# Patient Record
Sex: Female | Born: 1980 | Race: Black or African American | Hispanic: No | Marital: Single | Smoking: Current every day smoker
Health system: Southern US, Community
[De-identification: ages and names within clinical notes are randomized; demographics above are authoritative.]

## PROBLEM LIST (undated history)

## (undated) ENCOUNTER — Inpatient Hospital Stay (HOSPITAL_COMMUNITY): Payer: Self-pay

## (undated) DIAGNOSIS — F32A Depression, unspecified: Secondary | ICD-10-CM

## (undated) DIAGNOSIS — G894 Chronic pain syndrome: Secondary | ICD-10-CM

## (undated) DIAGNOSIS — A599 Trichomoniasis, unspecified: Secondary | ICD-10-CM

## (undated) DIAGNOSIS — N83209 Unspecified ovarian cyst, unspecified side: Secondary | ICD-10-CM

## (undated) DIAGNOSIS — D649 Anemia, unspecified: Secondary | ICD-10-CM

## (undated) DIAGNOSIS — J309 Allergic rhinitis, unspecified: Secondary | ICD-10-CM

## (undated) DIAGNOSIS — Z87820 Personal history of traumatic brain injury: Secondary | ICD-10-CM

## (undated) DIAGNOSIS — B999 Unspecified infectious disease: Secondary | ICD-10-CM

## (undated) DIAGNOSIS — F319 Bipolar disorder, unspecified: Secondary | ICD-10-CM

## (undated) DIAGNOSIS — R519 Headache, unspecified: Secondary | ICD-10-CM

## (undated) DIAGNOSIS — R51 Headache: Secondary | ICD-10-CM

## (undated) DIAGNOSIS — Z5189 Encounter for other specified aftercare: Secondary | ICD-10-CM

## (undated) DIAGNOSIS — N739 Female pelvic inflammatory disease, unspecified: Secondary | ICD-10-CM

## (undated) DIAGNOSIS — F419 Anxiety disorder, unspecified: Secondary | ICD-10-CM

## (undated) DIAGNOSIS — D563 Thalassemia minor: Secondary | ICD-10-CM

## (undated) DIAGNOSIS — M1990 Unspecified osteoarthritis, unspecified site: Secondary | ICD-10-CM

## (undated) DIAGNOSIS — F329 Major depressive disorder, single episode, unspecified: Secondary | ICD-10-CM

## (undated) HISTORY — DX: Trichomoniasis, unspecified: A59.9

## (undated) HISTORY — PX: PELVIC FRACTURE SURGERY: SHX119

## (undated) HISTORY — DX: Allergic rhinitis, unspecified: J30.9

## (undated) HISTORY — PX: OTHER SURGICAL HISTORY: SHX169

## (undated) HISTORY — DX: Chronic pain syndrome: G89.4

## (undated) HISTORY — DX: Personal history of traumatic brain injury: Z87.820

## (undated) HISTORY — DX: Bipolar disorder, unspecified: F31.9

## (undated) HISTORY — PX: HIP FRACTURE SURGERY: SHX118

---
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
## 2002-11-11 ENCOUNTER — Emergency Department (HOSPITAL_COMMUNITY): Admission: EM | Admit: 2002-11-11 | Discharge: 2002-11-11 | Payer: Self-pay | Admitting: Emergency Medicine

## 2002-11-13 ENCOUNTER — Emergency Department (HOSPITAL_COMMUNITY): Admission: EM | Admit: 2002-11-13 | Discharge: 2002-11-13 | Payer: Self-pay | Admitting: Emergency Medicine

## 2003-05-14 ENCOUNTER — Emergency Department (HOSPITAL_COMMUNITY): Admission: EM | Admit: 2003-05-14 | Discharge: 2003-05-14 | Payer: Self-pay | Admitting: Emergency Medicine

## 2003-08-30 ENCOUNTER — Emergency Department (HOSPITAL_COMMUNITY): Admission: EM | Admit: 2003-08-30 | Discharge: 2003-08-30 | Payer: Self-pay | Admitting: Emergency Medicine

## 2003-09-01 ENCOUNTER — Emergency Department (HOSPITAL_COMMUNITY): Admission: EM | Admit: 2003-09-01 | Discharge: 2003-09-01 | Payer: Self-pay | Admitting: *Deleted

## 2003-12-29 ENCOUNTER — Emergency Department (HOSPITAL_COMMUNITY): Admission: EM | Admit: 2003-12-29 | Discharge: 2003-12-29 | Payer: Self-pay | Admitting: Emergency Medicine

## 2004-02-02 ENCOUNTER — Inpatient Hospital Stay (HOSPITAL_COMMUNITY): Admission: AC | Admit: 2004-02-02 | Discharge: 2004-02-15 | Payer: Self-pay

## 2004-02-12 ENCOUNTER — Ambulatory Visit: Payer: Self-pay

## 2004-02-15 ENCOUNTER — Inpatient Hospital Stay (HOSPITAL_COMMUNITY)
Admission: RE | Admit: 2004-02-15 | Discharge: 2004-02-18 | Payer: Self-pay | Admitting: Physical Medicine & Rehabilitation

## 2004-03-21 ENCOUNTER — Encounter
Admission: RE | Admit: 2004-03-21 | Discharge: 2004-06-19 | Payer: Self-pay | Admitting: Physical Medicine & Rehabilitation

## 2004-03-22 ENCOUNTER — Ambulatory Visit: Payer: Self-pay | Admitting: Physical Medicine & Rehabilitation

## 2004-08-22 ENCOUNTER — Emergency Department (HOSPITAL_COMMUNITY): Admission: EM | Admit: 2004-08-22 | Discharge: 2004-08-22 | Payer: Self-pay | Admitting: Emergency Medicine

## 2004-10-27 ENCOUNTER — Emergency Department (HOSPITAL_COMMUNITY): Admission: EM | Admit: 2004-10-27 | Discharge: 2004-10-27 | Payer: Self-pay | Admitting: Emergency Medicine

## 2006-06-05 ENCOUNTER — Emergency Department (HOSPITAL_COMMUNITY): Admission: EM | Admit: 2006-06-05 | Discharge: 2006-06-05 | Payer: Self-pay | Admitting: Emergency Medicine

## 2007-01-19 ENCOUNTER — Emergency Department (HOSPITAL_COMMUNITY): Admission: EM | Admit: 2007-01-19 | Discharge: 2007-01-20 | Payer: Self-pay | Admitting: Emergency Medicine

## 2007-11-18 ENCOUNTER — Encounter: Admission: RE | Admit: 2007-11-18 | Discharge: 2007-11-18 | Payer: Self-pay | Admitting: Family Medicine

## 2008-08-10 ENCOUNTER — Emergency Department (HOSPITAL_COMMUNITY): Admission: EM | Admit: 2008-08-10 | Discharge: 2008-08-10 | Payer: Self-pay | Admitting: Emergency Medicine

## 2008-08-27 ENCOUNTER — Ambulatory Visit (HOSPITAL_COMMUNITY): Admission: RE | Admit: 2008-08-27 | Discharge: 2008-08-27 | Payer: Self-pay | Admitting: Obstetrics

## 2008-09-14 ENCOUNTER — Emergency Department (HOSPITAL_COMMUNITY): Admission: EM | Admit: 2008-09-14 | Discharge: 2008-09-14 | Payer: Self-pay | Admitting: Emergency Medicine

## 2008-09-16 ENCOUNTER — Inpatient Hospital Stay (HOSPITAL_COMMUNITY): Admission: AD | Admit: 2008-09-16 | Discharge: 2008-09-16 | Payer: Self-pay | Admitting: Obstetrics & Gynecology

## 2008-11-17 ENCOUNTER — Ambulatory Visit (HOSPITAL_COMMUNITY): Admission: RE | Admit: 2008-11-17 | Discharge: 2008-11-17 | Payer: Self-pay | Admitting: Obstetrics

## 2009-01-28 ENCOUNTER — Ambulatory Visit (HOSPITAL_COMMUNITY): Admission: RE | Admit: 2009-01-28 | Discharge: 2009-01-28 | Payer: Self-pay | Admitting: Obstetrics

## 2010-05-22 ENCOUNTER — Encounter: Payer: Self-pay | Admitting: Obstetrics

## 2010-08-05 LAB — RH IMMUNE GLOBULIN WORKUP (NOT WOMEN'S HOSP)
ABO/RH(D): A NEG
Antibody Screen: NEGATIVE

## 2010-08-09 LAB — URINALYSIS, ROUTINE W REFLEX MICROSCOPIC
Glucose, UA: NEGATIVE mg/dL
Leukocytes, UA: NEGATIVE
Nitrite: NEGATIVE
pH: 6 (ref 5.0–8.0)

## 2010-08-09 LAB — RHOGAM INJECTION

## 2010-08-09 LAB — URINE MICROSCOPIC-ADD ON

## 2010-08-09 LAB — WET PREP, GENITAL: WBC, Wet Prep HPF POC: NONE SEEN

## 2010-08-09 LAB — PREGNANCY, URINE: Preg Test, Ur: POSITIVE

## 2010-08-10 LAB — POCT I-STAT, CHEM 8
Calcium, Ion: 1.16 mmol/L (ref 1.12–1.32)
Chloride: 106 mEq/L (ref 96–112)
Glucose, Bld: 91 mg/dL (ref 70–99)
HCT: 44 % (ref 36.0–46.0)
Hemoglobin: 15 g/dL (ref 12.0–15.0)

## 2010-08-10 LAB — URINALYSIS, ROUTINE W REFLEX MICROSCOPIC
Bilirubin Urine: NEGATIVE
Glucose, UA: NEGATIVE mg/dL
Protein, ur: NEGATIVE mg/dL
Specific Gravity, Urine: 1.02 (ref 1.005–1.030)
Urobilinogen, UA: 1 mg/dL (ref 0.0–1.0)

## 2010-08-10 LAB — URINE MICROSCOPIC-ADD ON

## 2010-08-10 LAB — HCG, QUANTITATIVE, PREGNANCY: hCG, Beta Chain, Quant, S: 720 m[IU]/mL — ABNORMAL HIGH (ref <5)

## 2010-08-10 LAB — PREGNANCY, URINE: Preg Test, Ur: POSITIVE

## 2011-02-09 LAB — CBC
Hemoglobin: 11.6 — ABNORMAL LOW
MCHC: 32.1
Platelets: 265
RDW: 14.4 — ABNORMAL HIGH

## 2011-02-09 LAB — URINE MICROSCOPIC-ADD ON

## 2011-02-09 LAB — WET PREP, GENITAL
Clue Cells Wet Prep HPF POC: NONE SEEN
Yeast Wet Prep HPF POC: NONE SEEN

## 2011-02-09 LAB — DIFFERENTIAL
Basophils Absolute: 0.1
Basophils Relative: 1
Lymphocytes Relative: 22
Neutro Abs: 6.9
Neutrophils Relative %: 68

## 2011-02-09 LAB — URINALYSIS, ROUTINE W REFLEX MICROSCOPIC
Bilirubin Urine: NEGATIVE
Glucose, UA: NEGATIVE
Specific Gravity, Urine: 1.027
Urobilinogen, UA: 1

## 2011-05-02 NOTE — L&D Delivery Note (Signed)
Delivery Note At 10:47 PM a viable female was delivered via Vaginal, Spontaneous Delivery (Presentation: Left Occiput Anterior).  APGAR: 9, 9; weight .   Placenta status: Intact, Spontaneous.  Cord: 3 vessels with the following complications: None.  Cord pH: none  Anesthesia: None  Episiotomy: None Lacerations: None Suture Repair: none Est. Blood Loss (mL): 200  Mom to postpartum.  Baby to nursery-stable.  HARPER,CHARLES A 03/21/2012, 11:25 PM

## 2011-08-02 ENCOUNTER — Encounter (HOSPITAL_COMMUNITY): Payer: Self-pay | Admitting: *Deleted

## 2011-08-02 ENCOUNTER — Inpatient Hospital Stay (HOSPITAL_COMMUNITY)
Admission: AD | Admit: 2011-08-02 | Discharge: 2011-08-02 | Disposition: A | Discharge status: Home / Self Care | Payer: Self-pay | Source: Ambulatory Visit | Attending: Obstetrics & Gynecology | Admitting: Obstetrics & Gynecology

## 2011-08-02 DIAGNOSIS — O219 Vomiting of pregnancy, unspecified: Secondary | ICD-10-CM

## 2011-08-02 DIAGNOSIS — O210 Mild hyperemesis gravidarum: Secondary | ICD-10-CM | POA: Insufficient documentation

## 2011-08-02 HISTORY — DX: Depression, unspecified: F32.A

## 2011-08-02 HISTORY — DX: Major depressive disorder, single episode, unspecified: F32.9

## 2011-08-02 HISTORY — DX: Anemia, unspecified: D64.9

## 2011-08-02 HISTORY — DX: Anxiety disorder, unspecified: F41.9

## 2011-08-02 LAB — URINALYSIS, ROUTINE W REFLEX MICROSCOPIC
Glucose, UA: NEGATIVE mg/dL
Ketones, ur: 15 mg/dL — AB
Leukocytes, UA: NEGATIVE
Protein, ur: NEGATIVE mg/dL
pH: 6 (ref 5.0–8.0)

## 2011-08-02 LAB — URINE MICROSCOPIC-ADD ON

## 2011-08-02 LAB — POCT PREGNANCY, URINE: Preg Test, Ur: POSITIVE — AB

## 2011-08-02 MED ORDER — ONDANSETRON 8 MG PO TBDP
8.0000 mg | ORAL_TABLET | Freq: Once | ORAL | Status: AC
  Administered 2011-08-02: 8 mg via ORAL
  Filled 2011-08-02: qty 1

## 2011-08-02 MED ORDER — PROMETHAZINE HCL 25 MG PO TABS: 25.0000 mg | ORAL_TABLET | Freq: Four times a day (QID) | ORAL | Status: DC | PRN

## 2011-08-02 NOTE — MAU Provider Note (Signed)
History     CSN: [id number]  Arrival date and time: 08/02/11 2127   None     Chief Complaint  Patient presents with  . Possible Pregnancy   HPI 31 y.o. G3P2002 at 6.0 weeks. + nausea and vomiting x 1 week. Some pelvic "pressure", no pain or bleeding.    Past Medical History  Diagnosis Date  . Anemia   . Anxiety   . Depression     Past Surgical History  Procedure Date  . Pelvic fracture surgery   . Hip fracture surgery     History reviewed. No pertinent family history.  History  Substance Use Topics  . Smoking status: Current Everyday Smoker  . Smokeless tobacco: Not on file  . Alcohol Use: No    Allergies: No Known Allergies  No prescriptions prior to admission    Review of Systems  Constitutional: Negative.   Respiratory: Negative.   Cardiovascular: Negative.   Gastrointestinal: Positive for nausea and vomiting. Negative for abdominal pain, diarrhea and constipation.  Genitourinary: Negative for dysuria, urgency, frequency, hematuria and flank pain.       Negative for vaginal bleeding, vaginal discharge, dyspareunia  Musculoskeletal: Negative.   Neurological: Negative.   Psychiatric/Behavioral: Negative.    Physical Exam   Blood pressure 125/68, pulse 74, temperature 99 F (37.2 C), temperature source Oral, resp. rate 18, height 5\' 4"  (1.626 m), weight 113 lb (51.256 kg), last menstrual period 06/21/2011, SpO2 99.00%.  Physical Exam  Nursing note and vitals reviewed. Constitutional: She is oriented to person, place, and time. She appears well-developed and well-nourished. No distress.  Cardiovascular: Normal rate.   Respiratory: Effort normal.  Musculoskeletal: Normal range of motion.  Neurological: She is alert and oriented to person, place, and time.  Skin: Skin is warm and dry.  Psychiatric: She has a normal mood and affect.    MAU Course  Procedures  Results for orders placed during the hospital encounter of 08/02/11 (from the past 24  hour(s))  URINALYSIS, ROUTINE W REFLEX MICROSCOPIC     Status: Abnormal   Collection Time   08/02/11  9:50 PM      Component Value Range   Color, Urine YELLOW  YELLOW    APPearance CLEAR  CLEAR    Specific Gravity, Urine >1.030 (*) 1.005 - 1.030    pH 6.0  5.0 - 8.0    Glucose, UA NEGATIVE  NEGATIVE (mg/dL)   Hgb urine dipstick LARGE (*) NEGATIVE    Bilirubin Urine NEGATIVE  NEGATIVE    Ketones, ur 15 (*) NEGATIVE (mg/dL)   Protein, ur NEGATIVE  NEGATIVE (mg/dL)   Urobilinogen, UA 0.2  0.0 - 1.0 (mg/dL)   Nitrite NEGATIVE  NEGATIVE    Leukocytes, UA NEGATIVE  NEGATIVE   URINE MICROSCOPIC-ADD ON     Status: Abnormal   Collection Time   08/02/11  9:50 PM      Component Value Range   Squamous Epithelial / LPF FEW (*) RARE    WBC, UA 3-6  <3 (WBC/hpf)   RBC / HPF 11-20  <3 (RBC/hpf)   Bacteria, UA FEW (*) RARE    Urine-Other MUCOUS PRESENT    POCT PREGNANCY, URINE     Status: Abnormal   Collection Time   08/02/11  9:54 PM      Component Value Range   Preg Test, Ur POSITIVE (*) NEGATIVE    Zofran ODT for n/v in MAU  Assessment and Plan  31 y.o. G3P2002 at 6.[redacted] weeks EGA N/V  in pregnancy - rx phenergan Precautions rev'd, f/u for prenatal care ASAP  FRAZIER,NATALIE 08/02/2011, 10:47 PM

## 2011-08-02 NOTE — MAU Note (Signed)
Pt reports naseated  X 1 week, LMP 06/21/2011, positive home preg test.

## 2011-08-06 ENCOUNTER — Encounter (HOSPITAL_COMMUNITY): Payer: Self-pay | Admitting: *Deleted

## 2011-08-06 ENCOUNTER — Inpatient Hospital Stay (HOSPITAL_COMMUNITY)
Admission: AD | Admit: 2011-08-06 | Discharge: 2011-08-06 | Disposition: A | Discharge status: Hospice | Payer: Self-pay | Source: Ambulatory Visit | Attending: Obstetrics and Gynecology | Admitting: Obstetrics and Gynecology

## 2011-08-06 DIAGNOSIS — O210 Mild hyperemesis gravidarum: Secondary | ICD-10-CM | POA: Insufficient documentation

## 2011-08-06 DIAGNOSIS — O219 Vomiting of pregnancy, unspecified: Secondary | ICD-10-CM

## 2011-08-06 HISTORY — DX: Unspecified osteoarthritis, unspecified site: M19.90

## 2011-08-06 LAB — URINE MICROSCOPIC-ADD ON

## 2011-08-06 LAB — URINALYSIS, ROUTINE W REFLEX MICROSCOPIC
Glucose, UA: NEGATIVE mg/dL
Ketones, ur: 15 mg/dL — AB
Protein, ur: NEGATIVE mg/dL
Urobilinogen, UA: 1 mg/dL (ref 0.0–1.0)

## 2011-08-06 MED ORDER — PROMETHAZINE HCL 25 MG/ML IJ SOLN
25.0000 mg | Freq: Once | INTRAVENOUS | Status: AC
  Administered 2011-08-06: 25 mg via INTRAVENOUS
  Filled 2011-08-06: qty 1

## 2011-08-06 MED ORDER — PROMETHAZINE HCL 25 MG RE SUPP: 25.0000 mg | Freq: Four times a day (QID) | RECTAL | Status: DC | PRN

## 2011-08-06 NOTE — MAU Provider Note (Signed)
Attestation of Attending Supervision of Advanced Practitioner: Evaluation and management procedures were performed by the PA/NP/CNM/OB Fellow under my supervision/collaboration. Chart reviewed and agree with management and plan.  Kimsey Demaree V 08/06/2011 9:27 PM

## 2011-08-06 NOTE — Discharge Instructions (Signed)
Morning Sickness Morning sickness is when you feel sick to your stomach (nauseous) during pregnancy. You may feel sick to your stomach and throw up (vomit). You may feel sick in the morning, but you can feel this way any time of day. Some women feel very sick to their stomach and cannot stop throwing up (hyperemesis gravidarum). HOME CARE  Take multivitamins as told by your doctor. Taking multivitamins before getting pregnant can stop or lessen the harshness of morning sickness.   Eat dry toast or unsalted crackers before getting out of bed.   Eat 5 to 6 small meals a day.   Eat dry and bland foods like rice and baked potatoes.   Do not drink liquids with meals. Drink between meals.   Do not eat greasy, fatty, or spicy foods.   Have someone cook for you if the smell of food causes you to feel sick or throw up.   Do not take vitamins with iron, or as told by your doctor.   Eat protein when you need a snack (nuts, yogurt, cheese).   Eat unsweetened gelatins for dessert.   Wear a bracelet used for sea sickness (acupressure wristband).   Go to a doctor that puts thin needles into certain body points (acupuncture) to improve how you feel.   Do not smoke.   Use a humidifier to keep the air in your house free of odors.  GET HELP RIGHT AWAY IF:   You feel very sick to your stomach and cannot stop throwing up.   You pass out (faint).   You have a fever.   You need medicine to feel better.   You feel dizzy or lightheaded.   You are losing weight.   You need help knowing what to eat and what not to eat.  MAKE SURE YOU:   Understand these instructions.   Will watch your condition.   Will get help right away if you are not doing well or get worse.  Document Released: 05/25/2004 Document Revised: 04/06/2011 Document Reviewed: 07/15/2009 [employer] Patient Information 2012 [employer]. 

## 2011-08-06 NOTE — MAU Provider Note (Signed)
History     CSN: [id number]  Arrival date and time: 08/06/11 1707   First Provider Initiated Contact with Patient 08/06/11 1738      No chief complaint on file.  HPI  Angela Andrade is 31 y.o. G3P2002 [redacted]w[redacted]d gestation by LMP 2/20 returns to MUA with complaints of nausea, vomiting and weakness.   Was seen in MAU on 4/3 with same complaint given Rx for Phenergan, that has not worked.  Did not like Zofran given in MAU because it didn't help.  Patient reports being hungry.  Tried to eat a baked potato vomited 30 minutes later.  Fluids are more difficult to keep down.  She denies vaginal bleeding and abdominal/pelvic pain.   Past Medical History  Diagnosis Date  . Anemia   . Anxiety   . Depression   . Arthritis     Past Surgical History  Procedure Date  . Pelvic fracture surgery   . Hip fracture surgery   . Coma   . Spleen repair   . Brain injury     Family History  Problem Relation Age of Onset  . Cancer Mother   . Hypertension Mother   . Diabetes Maternal Aunt   . Hypertension Maternal Aunt   . Diabetes Maternal Uncle   . Hypertension Maternal Uncle   . Cancer Maternal Grandmother   . Diabetes Maternal Grandmother   . Hypertension Maternal Grandmother     History  Substance Use Topics  . Smoking status: Former Smoker -- 0.2 packs/day    Types: Cigarettes    Quit date: 07/06/2011  . Smokeless tobacco: Not on file  . Alcohol Use: No    Allergies: No Known Allergies  Prescriptions prior to admission  Medication Sig Dispense Refill  . promethazine (PHENERGAN) 25 MG tablet Take 1 tablet (25 mg total) by mouth every 6 (six) hours as needed for nausea.  60 tablet  0    Review of Systems  Constitutional: Negative.   Gastrointestinal: Positive for nausea and vomiting. Negative for abdominal pain.  Genitourinary: Negative.    Physical Exam   Last menstrual period 06/21/2011.  Physical Exam  Constitutional: She is oriented to person, place, and time. She appears  well-developed and well-nourished.  HENT:  Head: Normocephalic.  Neck: Normal range of motion.  Neurological: She is alert and oriented to person, place, and time.  Skin: Skin is warm and dry.  Psychiatric: She has a normal mood and affect. Her behavior is normal.     Results for orders placed during the hospital encounter of 08/06/11 (from the past 24 hour(s))  URINALYSIS, ROUTINE W REFLEX MICROSCOPIC     Status: Abnormal   Collection Time   08/06/11  5:20 PM      Component Value Range   Color, Urine YELLOW  YELLOW    APPearance HAZY (*) CLEAR    Specific Gravity, Urine 1.015  1.005 - 1.030    pH 8.0  5.0 - 8.0    Glucose, UA NEGATIVE  NEGATIVE (mg/dL)   Hgb urine dipstick MODERATE (*) NEGATIVE    Bilirubin Urine NEGATIVE  NEGATIVE    Ketones, ur 15 (*) NEGATIVE (mg/dL)   Protein, ur NEGATIVE  NEGATIVE (mg/dL)   Urobilinogen, UA 1.0  0.0 - 1.0 (mg/dL)   Nitrite NEGATIVE  NEGATIVE    Leukocytes, UA TRACE (*) NEGATIVE   URINE MICROSCOPIC-ADD ON     Status: Abnormal   Collection Time   08/06/11  5:20 PM  Component Value Range   Squamous Epithelial / LPF MANY (*) RARE    WBC, UA 3-6  <3 (WBC/hpf)   RBC / HPF 3-6  <3 (RBC/hpf)   Bacteria, UA MANY (*) RARE    MAU Course  Procedures   Urine culture to lab  MDM 1 liter of LR with Phenergan 25mg  bolus.  Patient has a ride home.   Urine culture pending.  Patient is not symptomatic-will wait for culture to treat.   18:45  Went in to check on patient.  She is feeling better and asking for crackers and sprite.  19:15  Patient was able to keep 4 graham crackers and a small sprite down.  Slightly nauseated but better with hydration and phenergan.  She is ready to go home.  Discussed how to use Phenergan suppositories.  Assessment and Plan  A;  Nausea and Vomiting in first trimester pregancy  P:  Rx for Phenergan suppositories      Encouraged her to begin prenatal care.  Plans care with [facility]     Encouraged to eat 5-6 small meals  a day  Calvyn Kurtzman,EVE M 08/06/2011, 5:40 PM

## 2011-08-06 NOTE — MAU Note (Signed)
Pt states she cannot keep anything down for the past week.  Was prescribed phenergan po and unable to keep them down.

## 2011-08-08 LAB — URINE CULTURE

## 2011-10-13 ENCOUNTER — Other Ambulatory Visit: Payer: Self-pay | Admitting: Obstetrics

## 2011-10-13 DIAGNOSIS — Z3689 Encounter for other specified antenatal screening: Secondary | ICD-10-CM

## 2011-10-13 LAB — OB RESULTS CONSOLE ABO/RH: RH Type: NEGATIVE

## 2011-10-13 LAB — OB RESULTS CONSOLE GC/CHLAMYDIA
Chlamydia: NEGATIVE
Gonorrhea: NEGATIVE

## 2011-10-13 LAB — OB RESULTS CONSOLE RPR: RPR: NONREACTIVE

## 2011-10-13 LAB — OB RESULTS CONSOLE HEPATITIS B SURFACE ANTIGEN: Hepatitis B Surface Ag: NEGATIVE

## 2011-10-13 LAB — OB RESULTS CONSOLE HIV ANTIBODY (ROUTINE TESTING): HIV: NONREACTIVE

## 2011-10-13 LAB — OB RESULTS CONSOLE RUBELLA ANTIBODY, IGM: Rubella: IMMUNE

## 2011-10-16 ENCOUNTER — Ambulatory Visit (HOSPITAL_COMMUNITY)
Admission: RE | Admit: 2011-10-16 | Discharge: 2011-10-16 | Disposition: A | Discharge status: Home / Self Care | Payer: Medicaid Other | Source: Ambulatory Visit | Attending: Obstetrics | Admitting: Obstetrics

## 2011-10-16 DIAGNOSIS — O3660X0 Maternal care for excessive fetal growth, unspecified trimester, not applicable or unspecified: Secondary | ICD-10-CM | POA: Insufficient documentation

## 2011-10-16 DIAGNOSIS — Z3689 Encounter for other specified antenatal screening: Secondary | ICD-10-CM

## 2011-10-16 DIAGNOSIS — O358XX0 Maternal care for other (suspected) fetal abnormality and damage, not applicable or unspecified: Secondary | ICD-10-CM | POA: Insufficient documentation

## 2011-10-16 DIAGNOSIS — Z363 Encounter for antenatal screening for malformations: Secondary | ICD-10-CM | POA: Insufficient documentation

## 2011-10-16 DIAGNOSIS — Z1389 Encounter for screening for other disorder: Secondary | ICD-10-CM | POA: Insufficient documentation

## 2011-11-13 ENCOUNTER — Other Ambulatory Visit: Payer: Self-pay | Admitting: Obstetrics & Gynecology

## 2012-01-02 ENCOUNTER — Inpatient Hospital Stay (HOSPITAL_COMMUNITY)
Admission: AD | Admit: 2012-01-02 | Discharge: 2012-01-02 | Disposition: A | Discharge status: Home / Self Care | Payer: Medicare Other | Source: Ambulatory Visit | Attending: Obstetrics | Admitting: Obstetrics

## 2012-01-02 DIAGNOSIS — Z298 Encounter for other specified prophylactic measures: Secondary | ICD-10-CM | POA: Insufficient documentation

## 2012-01-02 DIAGNOSIS — Z2989 Encounter for other specified prophylactic measures: Secondary | ICD-10-CM | POA: Insufficient documentation

## 2012-01-02 MED ORDER — RHO D IMMUNE GLOBULIN 1500 UNIT/2ML IJ SOLN
300.0000 ug | Freq: Once | INTRAMUSCULAR | Status: AC
  Administered 2012-01-02: 300 ug via INTRAMUSCULAR
  Filled 2012-01-02: qty 2

## 2012-01-02 NOTE — MAU Note (Signed)
Rhophylac booklet given and discussed

## 2012-01-02 NOTE — MAU Note (Signed)
No adverse effect from rhophylac

## 2012-01-03 LAB — RH IG WORKUP (INCLUDES ABO/RH)
ABO/RH(D): A NEG
Antibody Screen: NEGATIVE
Fetal Screen: NEGATIVE
Gestational Age(Wks): 27.6

## 2012-03-19 ENCOUNTER — Inpatient Hospital Stay (HOSPITAL_COMMUNITY)
Admission: AD | Admit: 2012-03-19 | Discharge: 2012-03-19 | Disposition: A | Discharge status: Home / Self Care | Payer: Medicare Other | Source: Ambulatory Visit | Attending: Obstetrics | Admitting: Obstetrics

## 2012-03-19 ENCOUNTER — Encounter (HOSPITAL_COMMUNITY): Payer: Self-pay | Admitting: Obstetrics and Gynecology

## 2012-03-19 DIAGNOSIS — O479 False labor, unspecified: Secondary | ICD-10-CM | POA: Insufficient documentation

## 2012-03-19 MED ORDER — OXYCODONE-ACETAMINOPHEN 5-325 MG PO TABS
2.0000 | ORAL_TABLET | Freq: Once | ORAL | Status: AC
  Administered 2012-03-19: 2 via ORAL
  Filled 2012-03-19: qty 2

## 2012-03-19 NOTE — Progress Notes (Signed)
Dr. Clearance Coots notified of patient's cervical exam 2, 50, -3 unchanged from prior exam. Pt feels better about pain after taking the percocet. Pt has an appt tomorrow and plans to keep it in order to schedule an induction.

## 2012-03-19 NOTE — MAU Note (Signed)
Pt presents to MAU with chief complaint of labor. Pt says contractions started last night at 7:00 pm and have progressively gotten worse throughout the day. Pt is a G3P2 at [redacted]w[redacted]d.

## 2012-03-20 ENCOUNTER — Encounter (HOSPITAL_COMMUNITY): Payer: Self-pay | Admitting: *Deleted

## 2012-03-20 ENCOUNTER — Telehealth (HOSPITAL_COMMUNITY): Payer: Self-pay | Admitting: *Deleted

## 2012-03-20 NOTE — Telephone Encounter (Signed)
Preadmission screen  

## 2012-03-21 ENCOUNTER — Encounter (HOSPITAL_COMMUNITY): Payer: Self-pay | Admitting: *Deleted

## 2012-03-21 ENCOUNTER — Inpatient Hospital Stay (HOSPITAL_COMMUNITY)
Admission: AD | Admit: 2012-03-21 | Discharge: 2012-03-23 | DRG: 775 | Disposition: A | Discharge status: Home / Self Care | Payer: Medicare Other | Source: Ambulatory Visit | Attending: Obstetrics | Admitting: Obstetrics

## 2012-03-21 DIAGNOSIS — O9903 Anemia complicating the puerperium: Principal | ICD-10-CM | POA: Diagnosis not present

## 2012-03-21 DIAGNOSIS — D649 Anemia, unspecified: Secondary | ICD-10-CM | POA: Diagnosis not present

## 2012-03-21 MED ORDER — OXYTOCIN 10 UNIT/ML IJ SOLN
INTRAMUSCULAR | Status: AC
  Filled 2012-03-21: qty 1

## 2012-03-21 MED ORDER — OXYCODONE-ACETAMINOPHEN 5-325 MG PO TABS: 2.0000 | ORAL_TABLET | Freq: Once | ORAL | Status: DC

## 2012-03-21 NOTE — MAU Note (Signed)
Pt complaining of contractions that started at about 2000.

## 2012-03-21 NOTE — H&P (Signed)
Angela Andrade is a 31 y.o. female presenting for UC's. Maternal Medical History:  Reason for admission: Reason for admission: contractions.  31 yo G3 P2    EDC` 11-27- 13.  Presented with UC's.  Contractions: Onset was 6-12 hours ago.   Frequency: regular.   Perceived severity is strong.    Fetal activity: Perceived fetal activity is normal.   Last perceived fetal movement was within the past hour.    Prenatal complications: no prenatal complications Prenatal Complications - Diabetes: none.    OB History    Grav Para Term Preterm Abortions TAB SAB Ect Mult Living   3 2 2       2      Past Medical History  Diagnosis Date  . Anemia   . Anxiety   . Depression   . Arthritis    Past Surgical History  Procedure Date  . Pelvic fracture surgery   . Hip fracture surgery   . Coma   . Spleen repair   . Brain injury    Family History: family history includes Cancer in her maternal grandmother and mother; Diabetes in her maternal aunt, maternal grandmother, and maternal uncle; and Hypertension in her maternal aunt, maternal grandmother, maternal uncle, and mother. Social History:  reports that she quit smoking about 8 months ago. Her smoking use included Cigarettes. She smoked .25 packs per day. She does not have any smokeless tobacco history on file. She reports that she does not drink alcohol or use illicit drugs.   Prenatal Transfer Tool  Maternal Diabetes: No Genetic Screening: Normal Maternal Ultrasounds/Referrals: Normal Fetal Ultrasounds or other Referrals:  None Maternal Substance Abuse:  No Significant Maternal Medications:  Meds include: Other:  Significant Maternal Lab Results:  Lab values include: Group B Strep negative Other Comments:  None  Review of Systems  All other systems reviewed and are negative.    Dilation: 3 Effacement (%): 90 Station: -2 Exam by:: Weston,RN Blood pressure 139/64, pulse 82, resp. rate 22, height 5' 4.5" (1.638 m), weight 176 lb  (79.833 kg), last menstrual period 06/21/2011. Maternal Exam:  Abdomen: Patient reports no abdominal tenderness. Fetal presentation: vertex  Introitus: Normal vulva. Normal vagina.    Physical Exam  Nursing note and vitals reviewed. Constitutional: She is oriented to person, place, and time. She appears well-developed and well-nourished.  HENT:  Head: Normocephalic and atraumatic.  Eyes: Conjunctivae normal are normal. Pupils are equal, round, and reactive to light.  Neck: Normal range of motion. Neck supple.  Cardiovascular: Normal rate and regular rhythm.   Respiratory: Effort normal and breath sounds normal.  GI: Soft.  Genitourinary: Vagina normal and uterus normal.  Musculoskeletal: Normal range of motion.  Neurological: She is alert and oriented to person, place, and time.  Skin: Skin is warm and dry.  Psychiatric: She has a normal mood and affect. Her behavior is normal. Judgment and thought content normal.    Prenatal labs: ABO, Rh: --/--/A NEG (09/03 1115) Antibody: NEG (09/03 1115) Rubella: Immune (06/14 0000) RPR: Nonreactive (06/14 0000)  HBsAg: Negative (06/14 0000)  HIV: Non-reactive (06/14 0000)  GBS: Negative (10/30 0000)   Assessment/Plan: 39.1 weeks.  Active labor.  Admit.   HARPER,CHARLES A 03/21/2012, 11:18 PM

## 2012-03-21 NOTE — MAU Note (Signed)
Pt presents with contractions and some bleeding at home, SVE yesterday 2

## 2012-03-22 ENCOUNTER — Encounter (HOSPITAL_COMMUNITY): Payer: Self-pay | Admitting: *Deleted

## 2012-03-22 LAB — CBC
HCT: 26.1 % — ABNORMAL LOW (ref 36.0–46.0)
Hemoglobin: 8.3 g/dL — ABNORMAL LOW (ref 12.0–15.0)
MCHC: 31.8 g/dL (ref 30.0–36.0)
MCV: 75.2 fL — ABNORMAL LOW (ref 78.0–100.0)
RDW: 13.7 % (ref 11.5–15.5)

## 2012-03-22 MED ORDER — IBUPROFEN 600 MG PO TABS: 600.0000 mg | ORAL_TABLET | Freq: Four times a day (QID) | ORAL | Status: DC

## 2012-03-22 MED ORDER — DIPHENHYDRAMINE HCL 25 MG PO CAPS: 25.0000 mg | ORAL_CAPSULE | Freq: Four times a day (QID) | ORAL | Status: DC | PRN

## 2012-03-22 MED ORDER — ONDANSETRON HCL 4 MG/2ML IJ SOLN: 4.0000 mg | INTRAMUSCULAR | Status: DC | PRN

## 2012-03-22 MED ORDER — PRENATAL MULTIVITAMIN CH: 1.0000 | ORAL_TABLET | Freq: Every day | ORAL | Status: DC

## 2012-03-22 MED ORDER — OXYCODONE-ACETAMINOPHEN 5-325 MG PO TABS: 1.0000 | ORAL_TABLET | ORAL | Status: DC | PRN

## 2012-03-22 MED ORDER — BENZOCAINE-MENTHOL 20-0.5 % EX AERO
1.0000 "application " | INHALATION_SPRAY | CUTANEOUS | Status: DC | PRN
  Filled 2012-03-22: qty 56

## 2012-03-22 MED ORDER — MEDROXYPROGESTERONE ACETATE 150 MG/ML IM SUSP: 150.0000 mg | INTRAMUSCULAR | Status: DC | PRN

## 2012-03-22 MED ORDER — SIMETHICONE 80 MG PO CHEW: 80.0000 mg | CHEWABLE_TABLET | ORAL | Status: DC | PRN

## 2012-03-22 MED ORDER — LANOLIN HYDROUS EX OINT: TOPICAL_OINTMENT | CUTANEOUS | Status: DC | PRN

## 2012-03-22 MED ORDER — DIBUCAINE 1 % RE OINT: 1.0000 "application " | TOPICAL_OINTMENT | RECTAL | Status: DC | PRN

## 2012-03-22 MED ORDER — OXYTOCIN 40 UNITS IN LACTATED RINGERS INFUSION - SIMPLE MED: 62.5000 mL/h | INTRAVENOUS | Status: DC | PRN

## 2012-03-22 MED ORDER — PRENATAL MULTIVITAMIN CH
1.0000 | ORAL_TABLET | Freq: Every day | ORAL | Status: DC
  Administered 2012-03-22 – 2012-03-23 (×2): 1 via ORAL
  Filled 2012-03-22 (×2): qty 1

## 2012-03-22 MED ORDER — WITCH HAZEL-GLYCERIN EX PADS: 1.0000 "application " | MEDICATED_PAD | CUTANEOUS | Status: DC | PRN

## 2012-03-22 MED ORDER — OXYCODONE-ACETAMINOPHEN 5-325 MG PO TABS
1.0000 | ORAL_TABLET | ORAL | Status: DC | PRN
  Administered 2012-03-22: 2 via ORAL
  Administered 2012-03-22 (×2): 1 via ORAL
  Administered 2012-03-22: 2 via ORAL
  Administered 2012-03-23 (×2): 1 via ORAL
  Filled 2012-03-22 (×4): qty 1
  Filled 2012-03-22 (×2): qty 2

## 2012-03-22 MED ORDER — ZOLPIDEM TARTRATE 5 MG PO TABS: 5.0000 mg | ORAL_TABLET | Freq: Every evening | ORAL | Status: DC | PRN

## 2012-03-22 MED ORDER — IBUPROFEN 600 MG PO TABS
600.0000 mg | ORAL_TABLET | Freq: Four times a day (QID) | ORAL | Status: DC
  Administered 2012-03-22 – 2012-03-23 (×6): 600 mg via ORAL
  Filled 2012-03-22 (×6): qty 1

## 2012-03-22 MED ORDER — DIBUCAINE 1 % RE OINT
1.0000 | TOPICAL_OINTMENT | RECTAL | Status: DC | PRN
Start: 2012-03-22 — End: 2012-03-23

## 2012-03-22 MED ORDER — BENZOCAINE-MENTHOL 20-0.5 % EX AERO: 1.0000 "application " | INHALATION_SPRAY | CUTANEOUS | Status: DC | PRN

## 2012-03-22 MED ORDER — TETANUS-DIPHTH-ACELL PERTUSSIS 5-2.5-18.5 LF-MCG/0.5 IM SUSP: 0.5000 mL | Freq: Once | INTRAMUSCULAR | Status: DC

## 2012-03-22 MED ORDER — SENNOSIDES-DOCUSATE SODIUM 8.6-50 MG PO TABS: 2.0000 | ORAL_TABLET | Freq: Every day | ORAL | Status: DC

## 2012-03-22 MED ORDER — ONDANSETRON HCL 4 MG PO TABS: 4.0000 mg | ORAL_TABLET | ORAL | Status: DC | PRN

## 2012-03-22 MED ORDER — TETANUS-DIPHTH-ACELL PERTUSSIS 5-2.5-18.5 LF-MCG/0.5 IM SUSP
0.5000 mL | Freq: Once | INTRAMUSCULAR | Status: AC
  Administered 2012-03-23: 0.5 mL via INTRAMUSCULAR
  Filled 2012-03-22: qty 0.5

## 2012-03-22 NOTE — MAU Note (Signed)
Pt states she started feeling  Abdominal cramping after 2300

## 2012-03-22 NOTE — Progress Notes (Signed)
Patient ID: Angela Andrade, female   DOB: [birth year], 31 y.o.   MRN: [id number] Post Partum Day 1 S/P spontaneous vaginal RH status/Rubella reviewed.  Feeding: breast Subjective: No HA, SOB, CP, F/C, breast symptoms. Normal vaginal bleeding, no clots.     Objective: BP 98/61  Pulse 65  Temp 97.9 F (36.6 C) (Oral)  Resp 20  Ht 5' 4.5" (1.638 m)  Wt 79.833 kg (176 lb)  BMI 29.74 kg/m2  SpO2 98%  LMP 06/21/2011  Breastfeeding? Unknown   Physical Exam:  General: alert Lochia: appropriate Uterine Fundus: firm DVT Evaluation: No evidence of DVT seen on physical exam. Ext: No c/c/e  Basename 03/22/12 0605  HGB 8.3*  HCT 26.1*      Assessment/Plan: 31 y.o.  PPD #1 .  normal postpartum exam Anemia stable Continue current postpartum care Ambulate   LOS: 1 day   [name],[name] A 03/22/2012, 2:05 PM

## 2012-03-23 MED ORDER — RHO D IMMUNE GLOBULIN 1500 UNIT/2ML IJ SOLN
300.0000 ug | Freq: Once | INTRAMUSCULAR | Status: AC
  Administered 2012-03-23: 300 ug via INTRAMUSCULAR
  Filled 2012-03-23: qty 2
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
## 2012-03-23 NOTE — Discharge Summary (Signed)
Obstetric Discharge Summary Reason for Admission: onset of labor Prenatal Procedures: none Intrapartum Procedures: spontaneous vaginal delivery Postpartum Procedures: none Complications-Operative and Postpartum: none Hemoglobin  Date Value Range Status  03/22/2012 8.3* 12.0 - 15.0 g/dL Final     HCT  Date Value Range Status  03/22/2012 26.1* 36.0 - 46.0 % Final    Physical Exam:  General: alert Lochia: appropriate Uterine Fundus: firm Incision: healing well DVT Evaluation: No evidence of DVT seen on physical exam.  Discharge Diagnoses: Term Pregnancy-delivered  Discharge Information: Date: 03/23/2012 Activity: pelvic rest Diet: routine Medications: Percocet Condition: improved Instructions: refer to practice specific booklet Discharge to: home Follow-up Information    Call in 6 weeks to follow up.   Contact information:   c harper         Newborn Data: Live born female  Birth Weight: 7 lb 12 oz (3515 g) APGAR: 9, 9  Home with mother.  Danae Oland A 03/23/2012, 7:22 AM

## 2012-03-24 LAB — RH IG WORKUP (INCLUDES ABO/RH): ABO/RH(D): A NEG

## 2012-03-25 NOTE — Progress Notes (Signed)
Post discharge chart review completed.  

## 2012-08-02 ENCOUNTER — Other Ambulatory Visit: Payer: Self-pay | Admitting: Obstetrics

## 2013-03-16 ENCOUNTER — Encounter (HOSPITAL_COMMUNITY): Payer: Self-pay | Admitting: Emergency Medicine

## 2013-03-16 ENCOUNTER — Emergency Department (HOSPITAL_COMMUNITY)
Admission: EM | Admit: 2013-03-16 | Discharge: 2013-03-17 | Disposition: A | Discharge status: Home / Self Care | Payer: Medicare Other | Attending: Emergency Medicine | Admitting: Emergency Medicine

## 2013-03-16 DIAGNOSIS — M129 Arthropathy, unspecified: Secondary | ICD-10-CM | POA: Insufficient documentation

## 2013-03-16 DIAGNOSIS — M5416 Radiculopathy, lumbar region: Secondary | ICD-10-CM

## 2013-03-16 DIAGNOSIS — IMO0002 Reserved for concepts with insufficient information to code with codable children: Secondary | ICD-10-CM | POA: Insufficient documentation

## 2013-03-16 DIAGNOSIS — Z8659 Personal history of other mental and behavioral disorders: Secondary | ICD-10-CM | POA: Insufficient documentation

## 2013-03-16 DIAGNOSIS — Z87891 Personal history of nicotine dependence: Secondary | ICD-10-CM | POA: Insufficient documentation

## 2013-03-16 DIAGNOSIS — Z862 Personal history of diseases of the blood and blood-forming organs and certain disorders involving the immune mechanism: Secondary | ICD-10-CM | POA: Insufficient documentation

## 2013-03-16 DIAGNOSIS — G8911 Acute pain due to trauma: Secondary | ICD-10-CM | POA: Insufficient documentation

## 2013-03-16 DIAGNOSIS — G8929 Other chronic pain: Secondary | ICD-10-CM | POA: Insufficient documentation
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
## 2013-03-17 MED ORDER — DEXAMETHASONE SODIUM PHOSPHATE 10 MG/ML IJ SOLN
10.0000 mg | Freq: Once | INTRAMUSCULAR | Status: AC
  Administered 2013-03-17: 10 mg via INTRAMUSCULAR
  Filled 2013-03-17: qty 1

## 2013-03-17 MED ORDER — DIAZEPAM 5 MG PO TABS
5.0000 mg | ORAL_TABLET | Freq: Once | ORAL | Status: AC
  Administered 2013-03-17: 5 mg via ORAL
  Filled 2013-03-17: qty 1

## 2013-03-17 MED ORDER — MORPHINE SULFATE 4 MG/ML IJ SOLN
4.0000 mg | Freq: Once | INTRAMUSCULAR | Status: AC
  Administered 2013-03-17: 4 mg via INTRAMUSCULAR
  Filled 2013-03-17: qty 1

## 2013-03-17 MED ORDER — METHOCARBAMOL 500 MG PO TABS: 500.0000 mg | ORAL_TABLET | Freq: Two times a day (BID) | ORAL | Status: DC | PRN

## 2013-03-17 NOTE — ED Notes (Signed)
Discussed w/ pt d/c instructions, prescriptions and follow up care, pt verbalized understanding.  Informed pt that she would need to wait 30 minutes d/t receiving IM morphine then she could e-sign her d/c instructions. However upon entering pt's room at 0120 pt had already left w/ her friend.
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
## 2013-03-17 NOTE — ED Provider Notes (Signed)
Medical screening examination/treatment/procedure(s) were performed by non-physician practitioner and as supervising physician I was immediately available for consultation/collaboration.  EKG Interpretation   None         Hanley Seamen, MD 03/17/13 [phone]

## 2013-03-25 ENCOUNTER — Ambulatory Visit: Payer: Medicare Other | Attending: Orthopaedic Surgery | Admitting: Physical Therapy

## 2013-09-18 ENCOUNTER — Encounter (HOSPITAL_COMMUNITY): Payer: Self-pay | Admitting: Emergency Medicine

## 2013-09-18 ENCOUNTER — Emergency Department (HOSPITAL_COMMUNITY): Payer: Medicare Other

## 2013-09-18 ENCOUNTER — Emergency Department (HOSPITAL_COMMUNITY)
Admission: EM | Admit: 2013-09-18 | Discharge: 2013-09-19 | Disposition: A | Discharge status: Home / Self Care | Payer: Medicare Other | Attending: Emergency Medicine | Admitting: Emergency Medicine

## 2013-09-18 DIAGNOSIS — Z8781 Personal history of (healed) traumatic fracture: Secondary | ICD-10-CM | POA: Insufficient documentation

## 2013-09-18 DIAGNOSIS — R51 Headache: Secondary | ICD-10-CM | POA: Insufficient documentation

## 2013-09-18 DIAGNOSIS — Z79899 Other long term (current) drug therapy: Secondary | ICD-10-CM | POA: Insufficient documentation

## 2013-09-18 DIAGNOSIS — M129 Arthropathy, unspecified: Secondary | ICD-10-CM | POA: Insufficient documentation

## 2013-09-18 DIAGNOSIS — Z87891 Personal history of nicotine dependence: Secondary | ICD-10-CM | POA: Insufficient documentation

## 2013-09-18 DIAGNOSIS — Z87828 Personal history of other (healed) physical injury and trauma: Secondary | ICD-10-CM | POA: Insufficient documentation

## 2013-09-18 DIAGNOSIS — Z862 Personal history of diseases of the blood and blood-forming organs and certain disorders involving the immune mechanism: Secondary | ICD-10-CM | POA: Insufficient documentation

## 2013-09-18 DIAGNOSIS — Z8659 Personal history of other mental and behavioral disorders: Secondary | ICD-10-CM | POA: Insufficient documentation

## 2013-09-18 DIAGNOSIS — R519 Headache, unspecified: Secondary | ICD-10-CM

## 2013-09-18 LAB — CBG MONITORING, ED: GLUCOSE-CAPILLARY: 85 mg/dL (ref 70–99)

## 2013-09-18 MED ORDER — SODIUM CHLORIDE 0.9 % IV BOLUS (SEPSIS)
1000.0000 mL | Freq: Once | INTRAVENOUS | Status: AC
  Administered 2013-09-18: 1000 mL via INTRAVENOUS

## 2013-09-18 MED ORDER — KETOROLAC TROMETHAMINE 30 MG/ML IJ SOLN
30.0000 mg | Freq: Once | INTRAMUSCULAR | Status: AC
  Administered 2013-09-18: 30 mg via INTRAVENOUS
  Filled 2013-09-18: qty 1

## 2013-09-18 MED ORDER — METOCLOPRAMIDE HCL 5 MG/ML IJ SOLN
10.0000 mg | Freq: Once | INTRAMUSCULAR | Status: AC
Start: 2013-09-18 — End: 2013-09-18
  Administered 2013-09-18: 10 mg via INTRAVENOUS
  Filled 2013-09-18: qty 2

## 2013-09-18 MED ORDER — DIPHENHYDRAMINE HCL 50 MG/ML IJ SOLN
25.0000 mg | Freq: Once | INTRAMUSCULAR | Status: AC
  Administered 2013-09-18: 25 mg via INTRAVENOUS
  Filled 2013-09-18: qty 1
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
## 2013-09-19 NOTE — Discharge Instructions (Signed)
Migraine Headache Follow up with the wellness clinic. Return to the ED if you develop new or worsening symptoms. A migraine headache is an intense, throbbing pain on one or both sides of your head. A migraine can last for 30 minutes to several hours. CAUSES  The exact cause of a migraine headache is not always known. However, a migraine may be caused when nerves in the brain become irritated and release chemicals that cause inflammation. This causes pain. Certain things may also trigger migraines, such as:  Alcohol.  Smoking.  Stress.  Menstruation.  Aged cheeses.  Foods or drinks that contain nitrates, glutamate, aspartame, or tyramine.  Lack of sleep.  Chocolate.  Caffeine.  Hunger.  Physical exertion.  Fatigue.  Medicines used to treat chest pain (nitroglycerine), birth control pills, estrogen, and some blood pressure medicines. SIGNS AND SYMPTOMS  Pain on one or both sides of your head.  Pulsating or throbbing pain.  Severe pain that prevents daily activities.  Pain that is aggravated by any physical activity.  Nausea, vomiting, or both.  Dizziness.  Pain with exposure to bright lights, loud noises, or activity.  General sensitivity to bright lights, loud noises, or smells. Before you get a migraine, you may get warning signs that a migraine is coming (aura). An aura may include:  Seeing flashing lights.  Seeing bright spots, halos, or zig-zag lines.  Having tunnel vision or blurred vision.  Having feelings of numbness or tingling.  Having trouble talking.  Having muscle weakness. DIAGNOSIS  A migraine headache is often diagnosed based on:  Symptoms.  Physical exam.  A CT scan or MRI of your head. These imaging tests cannot diagnose migraines, but they can help rule out other causes of headaches. TREATMENT Medicines may be given for pain and nausea. Medicines can also be given to help prevent recurrent migraines.  HOME CARE  INSTRUCTIONS  Only take over-the-counter or prescription medicines for pain or discomfort as directed by your health care provider. The use of long-term narcotics is not recommended.  Lie down in a dark, quiet room when you have a migraine.  Keep a journal to find out what may trigger your migraine headaches. For example, write down:  What you eat and drink.  How much sleep you get.  Any change to your diet or medicines.  Limit alcohol consumption.  Quit smoking if you smoke.  Get 7 9 hours of sleep, or as recommended by your health care provider.  Limit stress.  Keep lights dim if bright lights bother you and make your migraines worse. SEEK IMMEDIATE MEDICAL CARE IF:   Your migraine becomes severe.  You have a fever.  You have a stiff neck.  You have vision loss.  You have muscular weakness or loss of muscle control.  You start losing your balance or have trouble walking.  You feel faint or pass out.  You have severe symptoms that are different from your first symptoms. MAKE SURE YOU:   Understand these instructions.  Will watch your condition.  Will get help right away if you are not doing well or get worse. Document Released: 04/17/2005 Document Revised: 02/05/2013 Document Reviewed: 12/23/2012 [facility] Patient Information 2014 [place], [place].

## 2013-09-23 ENCOUNTER — Emergency Department (HOSPITAL_COMMUNITY)
Admission: EM | Admit: 2013-09-23 | Discharge: 2013-09-23 | Disposition: A | Discharge status: Home / Self Care | Payer: Medicare Other | Attending: Emergency Medicine | Admitting: Emergency Medicine

## 2013-09-23 ENCOUNTER — Emergency Department (HOSPITAL_COMMUNITY): Payer: Medicare Other

## 2013-09-23 ENCOUNTER — Encounter (HOSPITAL_COMMUNITY): Payer: Self-pay | Admitting: Emergency Medicine

## 2013-09-23 DIAGNOSIS — R82998 Other abnormal findings in urine: Secondary | ICD-10-CM | POA: Insufficient documentation

## 2013-09-23 DIAGNOSIS — Z87891 Personal history of nicotine dependence: Secondary | ICD-10-CM | POA: Insufficient documentation

## 2013-09-23 DIAGNOSIS — F411 Generalized anxiety disorder: Secondary | ICD-10-CM | POA: Insufficient documentation

## 2013-09-23 DIAGNOSIS — Z3202 Encounter for pregnancy test, result negative: Secondary | ICD-10-CM | POA: Insufficient documentation

## 2013-09-23 DIAGNOSIS — K529 Noninfective gastroenteritis and colitis, unspecified: Secondary | ICD-10-CM

## 2013-09-23 DIAGNOSIS — M129 Arthropathy, unspecified: Secondary | ICD-10-CM | POA: Insufficient documentation

## 2013-09-23 DIAGNOSIS — K5289 Other specified noninfective gastroenteritis and colitis: Secondary | ICD-10-CM | POA: Insufficient documentation

## 2013-09-23 DIAGNOSIS — Z791 Long term (current) use of non-steroidal anti-inflammatories (NSAID): Secondary | ICD-10-CM | POA: Insufficient documentation

## 2013-09-23 DIAGNOSIS — R8271 Bacteriuria: Secondary | ICD-10-CM

## 2013-09-23 DIAGNOSIS — R109 Unspecified abdominal pain: Secondary | ICD-10-CM

## 2013-09-23 DIAGNOSIS — Z9889 Other specified postprocedural states: Secondary | ICD-10-CM | POA: Insufficient documentation

## 2013-09-23 LAB — URINALYSIS, ROUTINE W REFLEX MICROSCOPIC
Glucose, UA: NEGATIVE mg/dL
Ketones, ur: >80 mg/dL — AB
Nitrite: NEGATIVE
Protein, ur: 30 mg/dL — AB
Specific Gravity, Urine: 1.031 — ABNORMAL HIGH (ref 1.005–1.030)
Urobilinogen, UA: 1 mg/dL (ref 0.0–1.0)
pH: 6 (ref 5.0–8.0)

## 2013-09-23 LAB — CBC WITH DIFFERENTIAL/PLATELET
BASOS ABS: 0 10*3/uL (ref 0.0–0.1)
Basophils Relative: 0 % (ref 0–1)
EOS PCT: 0 % (ref 0–5)
Eosinophils Absolute: 0 10*3/uL (ref 0.0–0.7)
HCT: 35.6 % — ABNORMAL LOW (ref 36.0–46.0)
Hemoglobin: 11.9 g/dL — ABNORMAL LOW (ref 12.0–15.0)
LYMPHS ABS: 1.5 10*3/uL (ref 0.7–4.0)
Lymphocytes Relative: 10 % — ABNORMAL LOW (ref 12–46)
MCH: 25.3 pg — ABNORMAL LOW (ref 26.0–34.0)
MCHC: 33.4 g/dL (ref 30.0–36.0)
MCV: 75.6 fL — AB (ref 78.0–100.0)
Monocytes Absolute: 0.9 10*3/uL (ref 0.1–1.0)
Monocytes Relative: 6 % (ref 3–12)
NEUTROS ABS: 13 10*3/uL — AB (ref 1.7–7.7)
Neutrophils Relative %: 84 % — ABNORMAL HIGH (ref 43–77)
PLATELETS: 234 10*3/uL (ref 150–400)
RBC: 4.71 MIL/uL (ref 3.87–5.11)
RDW: 13.4 % (ref 11.5–15.5)
WBC: 15.4 10*3/uL — AB (ref 4.0–10.5)

## 2013-09-23 LAB — POC URINE PREG, ED: Preg Test, Ur: NEGATIVE

## 2013-09-23 LAB — COMPREHENSIVE METABOLIC PANEL
ALK PHOS: 60 U/L (ref 39–117)
AST: 12 U/L (ref 0–37)
Albumin: 4.6 g/dL (ref 3.5–5.2)
BUN: 8 mg/dL (ref 6–23)
CALCIUM: 10 mg/dL (ref 8.4–10.5)
CO2: 22 mEq/L (ref 19–32)
Chloride: 105 mEq/L (ref 96–112)
Creatinine, Ser: 0.68 mg/dL (ref 0.50–1.10)
GFR calc Af Amer: >90 mL/min (ref >90)
Glucose, Bld: 91 mg/dL (ref 70–99)
POTASSIUM: 3.8 meq/L (ref 3.7–5.3)
SODIUM: 143 meq/L (ref 137–147)
Total Bilirubin: 0.6 mg/dL (ref 0.3–1.2)
Total Protein: 8.1 g/dL (ref 6.0–8.3)

## 2013-09-23 LAB — URINE MICROSCOPIC-ADD ON

## 2013-09-23 LAB — LIPASE, BLOOD: Lipase: 18 U/L (ref 11–59)

## 2013-09-23 MED ORDER — SODIUM CHLORIDE 0.9 % IV BOLUS (SEPSIS)
500.0000 mL | Freq: Once | INTRAVENOUS | Status: AC
  Administered 2013-09-23: 500 mL via INTRAVENOUS

## 2013-09-23 MED ORDER — MORPHINE SULFATE 4 MG/ML IJ SOLN
4.0000 mg | Freq: Once | INTRAMUSCULAR | Status: AC
  Administered 2013-09-23: 4 mg via INTRAVENOUS
  Filled 2013-09-23: qty 1

## 2013-09-23 MED ORDER — ONDANSETRON HCL 4 MG/2ML IJ SOLN
4.0000 mg | Freq: Once | INTRAMUSCULAR | Status: AC
  Administered 2013-09-23: 4 mg via INTRAVENOUS
  Filled 2013-09-23: qty 2

## 2013-09-23 MED ORDER — DICYCLOMINE HCL 20 MG PO TABS: 20.0000 mg | ORAL_TABLET | Freq: Two times a day (BID) | ORAL | Status: DC

## 2013-09-23 MED ORDER — IOHEXOL 300 MG/ML  SOLN
80.0000 mL | Freq: Once | INTRAMUSCULAR | Status: AC | PRN
  Administered 2013-09-23: 80 mL via INTRAVENOUS

## 2013-09-23 MED ORDER — DIPHENHYDRAMINE HCL 50 MG/ML IJ SOLN
12.5000 mg | Freq: Once | INTRAMUSCULAR | Status: AC
  Administered 2013-09-23: 12.5 mg via INTRAVENOUS
  Filled 2013-09-23: qty 1

## 2013-09-23 MED ORDER — DICYCLOMINE HCL 10 MG PO CAPS
10.0000 mg | ORAL_CAPSULE | Freq: Once | ORAL | Status: AC
  Administered 2013-09-23: 10 mg via ORAL
  Filled 2013-09-23: qty 1

## 2013-09-23 MED ORDER — ONDANSETRON 4 MG PO TBDP: 4.0000 mg | ORAL_TABLET | Freq: Three times a day (TID) | ORAL | Status: DC | PRN

## 2013-09-23 MED ORDER — IOHEXOL 300 MG/ML  SOLN
25.0000 mL | Freq: Once | INTRAMUSCULAR | Status: AC | PRN
  Administered 2013-09-23: 25 mL via ORAL

## 2013-09-23 MED ORDER — SODIUM CHLORIDE 0.9 % IV BOLUS (SEPSIS): 1000.0000 mL | Freq: Once | INTRAVENOUS | Status: DC

## 2013-09-23 MED ORDER — FENTANYL CITRATE 0.05 MG/ML IJ SOLN
100.0000 ug | Freq: Once | INTRAMUSCULAR | Status: DC
  Filled 2013-09-23: qty 2

## 2013-09-23 NOTE — ED Notes (Signed)
Patient has been having abdominal pain since Friday she denies N/V and diarrhea, she denies urinary symptoms.  She advises she is not pregnant because she just had her period.  Patient is double over in pain.

## 2013-09-23 NOTE — Discharge Instructions (Signed)
Please follow up with your primary care physician in 1-2 days. If you do not have one please call the [facility] number listed above. Please take medications as prescribed for pain. Please read all discharge instructions and return precautions.    Abdominal Pain, Adult Many things can cause abdominal pain. Usually, abdominal pain is not caused by a disease and will improve without treatment. It can often be observed and treated at home. Your health care provider will do a physical exam and possibly order blood tests and X-rays to help determine the seriousness of your pain. However, in many cases, more time must pass before a clear cause of the pain can be found. Before that point, your health care provider may not know if you need more testing or further treatment. HOME CARE INSTRUCTIONS  Monitor your abdominal pain for any changes. The following actions may help to alleviate any discomfort you are experiencing:  Only take over-the-counter or prescription medicines as directed by your health care provider.  Do not take laxatives unless directed to do so by your health care provider.  Try a clear liquid diet (broth, tea, or water) as directed by your health care provider. Slowly move to a bland diet as tolerated. SEEK MEDICAL CARE IF:  You have unexplained abdominal pain.  You have abdominal pain associated with nausea or diarrhea.  You have pain when you urinate or have a bowel movement.  You experience abdominal pain that wakes you in the night.  You have abdominal pain that is worsened or improved by eating food.  You have abdominal pain that is worsened with eating fatty foods. SEEK IMMEDIATE MEDICAL CARE IF:   Your pain does not go away within 2 hours.  You have a fever.  You keep throwing up (vomiting).  Your pain is felt only in portions of the abdomen, such as the right side or the left lower portion of the abdomen.  You pass bloody or black tarry  stools. MAKE SURE YOU:  Understand these instructions.   Will watch your condition.   Will get help right away if you are not doing well or get worse.  Document Released: 01/25/2005 Document Revised: 02/05/2013 Document Reviewed: 12/25/2012 [facility] Patient Information 2014 [place], [place]. Viral Gastroenteritis Viral gastroenteritis is also known as stomach flu. This condition affects the stomach and intestinal tract. It can cause sudden diarrhea and vomiting. The illness typically lasts 3 to 8 days. Most people develop an immune response that eventually gets rid of the virus. While this natural response develops, the virus can make you quite ill. CAUSES  Many different viruses can cause gastroenteritis, such as rotavirus or noroviruses. You can catch one of these viruses by consuming contaminated food or water. You may also catch a virus by sharing utensils or other personal items with an infected person or by touching a contaminated surface. SYMPTOMS  The most common symptoms are diarrhea and vomiting. These problems can cause a severe loss of body fluids (dehydration) and a body salt (electrolyte) imbalance. Other symptoms may include:  Fever.  Headache.  Fatigue.  Abdominal pain. DIAGNOSIS  Your caregiver can usually diagnose viral gastroenteritis based on your symptoms and a physical exam. A stool sample may also be taken to test for the presence of viruses or other infections. TREATMENT  This illness typically goes away on its own. Treatments are aimed at rehydration. The most serious cases of viral gastroenteritis involve vomiting so severely that you are not able  to keep fluids down. In these cases, fluids must be given through an intravenous line (IV). HOME CARE INSTRUCTIONS   Drink enough fluids to keep your urine clear or pale yellow. Drink small amounts of fluids frequently and increase the amounts as tolerated.  Ask your caregiver for specific rehydration  instructions.  Avoid:  Foods high in sugar.  Alcohol.  Carbonated drinks.  Tobacco.  Juice.  Caffeine drinks.  Extremely hot or cold fluids.  Fatty, greasy foods.  Too much intake of anything at one time.  Dairy products until 24 to 48 hours after diarrhea stops.  You may consume probiotics. Probiotics are active cultures of beneficial bacteria. They may lessen the amount and number of diarrheal stools in adults. Probiotics can be found in yogurt with active cultures and in supplements.  Wash your hands well to avoid spreading the virus.  Only take over-the-counter or prescription medicines for pain, discomfort, or fever as directed by your caregiver. Do not give aspirin to children. Antidiarrheal medicines are not recommended.  Ask your caregiver if you should continue to take your regular prescribed and over-the-counter medicines.  Keep all follow-up appointments as directed by your caregiver. SEEK IMMEDIATE MEDICAL CARE IF:   You are unable to keep fluids down.  You do not urinate at least once every 6 to 8 hours.  You develop shortness of breath.  You notice blood in your stool or vomit. This may look like coffee grounds.  You have abdominal pain that increases or is concentrated in one small area (localized).  You have persistent vomiting or diarrhea.  You have a fever.  The patient is a child younger than 3 months, and he or she has a fever.  The patient is a child older than 3 months, and he or she has a fever and persistent symptoms.  The patient is a child older than 3 months, and he or she has a fever and symptoms suddenly get worse.  The patient is a baby, and he or she has no tears when crying. MAKE SURE YOU:   Understand these instructions.  Will watch your condition.  Will get help right away if you are not doing well or get worse. Document Released: 04/17/2005 Document Revised: 07/10/2011 Document Reviewed: 02/01/2011 [facility] Patient  Information 2014 [place]  Asymptomatic Bacteriuria, Female Asymptomatic bacteriuria is a significant number of bacteria in your urine that occur without the usual symptoms of burning or frequent urination. The following conditions increase risk of asymptomatic bacteriuria:  Diabetes mellitus.  Advanced age.  Pregnancy in the first trimester.  Kidney stones.  Kidney transplants.  Leaky kidney tube valve in young children (reflux). Treatment for asymptomatic bacteriuria is not required in most people and can lead to other problems such as yeast overgrowth and development of resistant bacteria. However, some people, such as pregnant women, do need treatment to prevent kidney infection. Asymptomatic bacteriuria in pregnancy is also associated with fetal growth restriction, premature labor, and newborn death. HOME CARE INSTRUCTIONS Monitor your bacteriuria for any changes. The following actions may help to alleviate any discomfort you are experiencing:  Drink enough water and fluids to keep your urine clear or pale yellow. Go to the bathroom more frequently to keep your bladder empty.  Keep the area around your vagina and rectum clean. Wipe yourself from front to back after urinating. SEEK IMMEDIATE MEDICAL CARE IF:  You develop signs of an infection such asburning with urination, frequency of voiding, back pain, or fever.  You  have blood in the urine.  You develop a fever. Document Released: 04/17/2005 Document Revised: 12/18/2012 Document Reviewed: 10/07/2012 [facility] Patient Information 2014 [place], [place].

## 2013-09-23 NOTE — ED Provider Notes (Signed)
CSN: [id number]     Arrival date & time 09/23/13  1241 History   First MD Initiated Contact with Patient 09/23/13 1301     Chief Complaint  Patient presents with  . Abdominal Pain     (Consider location/radiation/quality/duration/timing/severity/associated sxs/prior Treatment) HPI Comments: Patient is a 33 yo F PMHx significant for anemia, anxiety, depression, arthritis presenting to the ED for diffuse abdominal pain that is severe that she describes as "waves of labor pains." Patient states this pain has been constant since Friday. She believed she may have been constipated, despite having a normal BM every day, so was taking PO laxatives. Alleviating factors: None. Aggravating factors: None. Patient denies any associated fevers, chills, nausea, vomiting, urinary symptoms, constipation, vaginal bleeding or discharge, pelvic pain. Patient has had a few loose stools do to laxative use. Abdominal surgical history includes spleen repair, pelvic fracture repair. Patient states she just finished her menstrual cycle in the last day or 2. Denies any alcohol or recreational drug use.     Patient is a 33 y.o. female presenting with abdominal pain.  Abdominal Pain Associated symptoms: no chest pain, no chills, no constipation, no diarrhea, no dysuria, no fever, no hematuria, no nausea, no shortness of breath, no vaginal bleeding, no vaginal discharge and no vomiting     Past Medical History  Diagnosis Date  . Anemia   . Anxiety   . Depression   . Arthritis    Past Surgical History  Procedure Laterality Date  . Pelvic fracture surgery    . Hip fracture surgery    . Coma    . Spleen repair    . Brain injury     Family History  Problem Relation Age of Onset  . Cancer Mother   . Hypertension Mother   . Diabetes Maternal Aunt   . Hypertension Maternal Aunt   . Diabetes Maternal Uncle   . Hypertension Maternal Uncle   . Cancer Maternal Grandmother   . Diabetes Maternal Grandmother   .  Hypertension Maternal Grandmother    History  Substance Use Topics  . Smoking status: Former Smoker -- 0.25 packs/day    Types: Cigarettes    Quit date: 07/06/2011  . Smokeless tobacco: Not on file  . Alcohol Use: No   OB History   Grav Para Term Preterm Abortions TAB SAB Ect Mult Living   3 3 3       3      Review of Systems  Constitutional: Negative for fever and chills.  Respiratory: Negative for shortness of breath.   Cardiovascular: Negative for chest pain.  Gastrointestinal: Positive for abdominal pain. Negative for nausea, vomiting, diarrhea and constipation.  Genitourinary: Negative for dysuria, urgency, frequency, hematuria, flank pain, decreased urine volume, vaginal bleeding, vaginal discharge, enuresis, menstrual problem and pelvic pain.  All other systems reviewed and are negative.  Allergies  Hydrocodone; Morphine and related; and Zyprexa  Home Medications   Prior to Admission medications   Medication Sig Start Date End Date Taking? Authorizing Provider  ibuprofen (ADVIL,MOTRIN) 200 MG tablet Take 400 mg by mouth daily as needed (pain).     Historical Provider, MD   BP 83/64  Pulse 69  Temp(Src) 98.2 F (36.8 C) (Oral)  Resp 18  SpO2 99%  LMP 09/18/2013 Physical Exam  Nursing note and vitals reviewed. Constitutional: She is oriented to person, place, and time. She appears well-developed and well-nourished.  HENT:  Head: Normocephalic and atraumatic.  Right Ear: External ear normal.  Left  Ear: External ear normal.  Nose: Nose normal.  Mouth/Throat: Oropharynx is clear and moist. No oropharyngeal exudate.  Eyes: Conjunctivae are normal.  Neck: Neck supple.  Cardiovascular: Normal rate, regular rhythm and normal heart sounds.   Pulmonary/Chest: Effort normal and breath sounds normal. No respiratory distress.  Abdominal: Soft. Bowel sounds are normal. She exhibits no distension. There is generalized tenderness. There is no rigidity, no rebound, no guarding  and no CVA tenderness.  Musculoskeletal: Normal range of motion. She exhibits no edema.  Neurological: She is alert and oriented to person, place, and time.  Skin: Skin is warm and dry.  Psychiatric: Her speech is normal. Her mood appears anxious.    ED Course  Procedures (including critical care time) Medications  fentaNYL (SUBLIMAZE) injection 100 mcg (not administered)  sodium chloride 0.9 % bolus 500 mL (not administered)  dicyclomine (BENTYL) capsule 10 mg (not administered)  morphine 4 MG/ML injection 4 mg (4 mg Intravenous Given 09/23/13 1402)  diphenhydrAMINE (BENADRYL) injection 12.5 mg (12.5 mg Intravenous Given 09/23/13 1355)  ondansetron (ZOFRAN) injection 4 mg (4 mg Intravenous Given 09/23/13 1352)  iohexol (OMNIPAQUE) 300 MG/ML solution 25 mL (25 mLs Oral Contrast Given 09/23/13 1438)  iohexol (OMNIPAQUE) 300 MG/ML solution 80 mL (80 mLs Intravenous Contrast Given 09/23/13 1515)    Labs Review Labs Reviewed  CBC WITH DIFFERENTIAL - Abnormal; Notable for the following:    WBC 15.4 (*)    Hemoglobin 11.9 (*)    HCT 35.6 (*)    MCV 75.6 (*)    MCH 25.3 (*)    Neutrophils Relative % 84 (*)    Neutro Abs 13.0 (*)    Lymphocytes Relative 10 (*)    All other components within normal limits  URINALYSIS, ROUTINE W REFLEX MICROSCOPIC - Abnormal; Notable for the following:    Color, Urine AMBER (*)    APPearance CLOUDY (*)    Specific Gravity, Urine 1.031 (*)    Hgb urine dipstick LARGE (*)    Bilirubin Urine SMALL (*)    Ketones, ur >80 (*)    Protein, ur 30 (*)    Leukocytes, UA MODERATE (*)    All other components within normal limits  URINE MICROSCOPIC-ADD ON - Abnormal; Notable for the following:    Squamous Epithelial / LPF MANY (*)    Bacteria, UA FEW (*)    All other components within normal limits  URINE CULTURE  COMPREHENSIVE METABOLIC PANEL  LIPASE, BLOOD  POC URINE PREG, ED    Imaging Review Ct Abdomen Pelvis W Contrast  09/23/2013   CLINICAL DATA:   Abdominal pain  EXAM: CT ABDOMEN AND PELVIS WITH CONTRAST  TECHNIQUE: Multidetector CT imaging of the abdomen and pelvis was performed using the standard protocol following bolus administration of intravenous contrast.  CONTRAST:  69mL OMNIPAQUE IOHEXOL 300 MG/ML  SOLN  COMPARISON:  None.  FINDINGS: The lung bases are free of acute infiltrate or sizable effusion.  The liver, spleen, gallbladder, adrenal glands and pancreas are all normal in their CT appearance. The kidneys are well visualized with a normal enhancement pattern. No renal calculi or urinary tract obstructive changes are seen.  The appendix is air-filled and within normal limits. No inflammatory changes are seen. The bladder is partially distended. Some mild ovarian cystic change is seen. No free pelvic fluid is noted. Multiple fluid-filled loops of colon are seen although no obstructive changes are noted. This may be related to an underlying gastroenteritis. No free air is seen.  IMPRESSION:  Fluid-filled loops of colon likely related to an underlying gastroenteritis. No obstructive changes are seen.  No other focal abnormality is noted.   Electronically Signed   By: Alcide CleverMark  Lukens M.D.   On: 09/23/2013 15:37     EKG Interpretation None      MDM   Final diagnoses:  Abdominal pain  Enteritis    Filed Vitals:   09/23/13 1532  BP: 83/64  Pulse: 69  Temp:   Resp: 18   Afebrile, NAD, non-toxic appearing, AAOx4.   Patient is nontoxic, nonseptic appearing, in no apparent distress.  Patient's pain and other symptoms adequately managed in emergency department.  Fluid bolus given.  Labs, imaging and vitals reviewed. UA reviewed, Hgb likely from menstrual cycle recently finishing yesterday. Patient with moderate leukocytes, many of his epithelium and few bacteria. Nitrite negative. Patient is asymptomatic in terms of urinary symptoms. Urine culture will be sent and if returns positive for urinary tract infection a antibiotic will be called in  for the patient at that time. Patient has not had any CVA tenderness or back tenderness to suggest any hila nephritis. Patient does not meet the SIRS or Sepsis criteria.  On repeat exam patient does not have a surgical abdomen and there are nor peritoneal signs.  No indication of appendicitis, bowel obstruction, bowel perforation, cholecystitis, diverticulitis or ectopic pregnancy.  CT scan shows signs of enteritis. Patient discharged home with symptomatic treatment and given strict instructions for follow-up with their primary care physician.  I have also discussed reasons to return immediately to the ER.  Patient expresses understanding and agrees with plan.  Patient will be discharged home once IVF have been completed. Dr. Criss AlvineGoldston will discharge patient.   Patient d/w with Dr. Bebe ShaggyWickline, agrees with plan.         Jeannetta EllisJennifer L Diksha Tagliaferro, PA-C 09/23/13 1619

## 2013-09-23 NOTE — ED Notes (Signed)
Family updated in waiting room

## 2013-09-23 NOTE — Progress Notes (Signed)
  CARE MANAGEMENT ED NOTE 09/23/2013  Patient:  Angela, Andrade   Account Number:  [id number]  Date Initiated:  09/23/2013  Documentation initiated by:  Ferdinand Cava  Subjective/Objective Assessment:   33 yo female presenting to the ED with c/o abdominal pain     Subjective/Objective Assessment Detail:   HPI Comments: Patient is a 33 yo F PMHx significant for anemia, anxiety, depression, arthritis presenting to the ED for diffuse abdominal pain that is severe that she describes as "waves of labor pains." Patient states this pain has been constant since Friday. She believed she may have been constipated, despite having a normal BM every day, so was taking PO laxatives. Alleviating factors: None. Aggravating factors: None. Patient denies any associated fevers, chills, nausea, vomiting, urinary symptoms, constipation, vaginal bleeding or discharge, pelvic pain. Patient has had a few loose stools do to laxative use. Abdominal surgical history includes spleen repair, pelvic fracture repair. Patient states she just finished her menstrual cycle in the last day or 2. Denies any alcohol or recreational drug use.     Action/Plan:   Patient is to call Dr. Ashley Royalty and set up an initial appointment to establish care with a PCP   Action/Plan Detail:   Anticipated DC Date:       Status Recommendation to Physician:   Result of Recommendation:      DC Planning Services  PCP issues    Choice offered to / List presented to:  C-1 Patient          Status of service:    ED Comments:   ED Comments Detail:  CM spoke with the patient about her Medicaid/ Medicare status and no documented PCP. Patient confirmed her insurance and stated that she does not have a PCP. Patient stated that she has had Medicaid/Medicare for 10 years but no PCP. Patient had her card and there was no listed PCP. Patient stated that she wants to use the [facility]. This CM then called the [facility] to schedule and appointment but was informed  that they do not have any availability and are referring patient to Dr. Ashley Royalty and to have the patient call and scheduled the appointment. Explained to patient that the [facility] is booked and not accepting new patients at this time but they are partnering with Dr. Ashley Royalty and provided the patient with her contact and address information. Explained to the patient that she can use the [facility] pharmacy and resources if she is connected to Dr. Ashley Royalty. Patient verbalized understanding and had no further questions or concerns.

## 2013-09-24 LAB — URINE CULTURE: Colony Count: >=100000

## 2013-09-24 NOTE — ED Provider Notes (Signed)
Medical screening examination/treatment/procedure(s) were performed by non-physician practitioner and as supervising physician I was immediately available for consultation/collaboration.   EKG Interpretation None        Joya Gaskins, MD 09/24/13 Barry Brunner

## 2013-10-09 ENCOUNTER — Encounter (HOSPITAL_COMMUNITY): Payer: Self-pay | Admitting: Emergency Medicine

## 2013-10-09 ENCOUNTER — Emergency Department (HOSPITAL_COMMUNITY)
Admission: EM | Admit: 2013-10-09 | Discharge: 2013-10-09 | Disposition: A | Discharge status: Home / Self Care | Payer: Medicare Other | Attending: Emergency Medicine | Admitting: Emergency Medicine

## 2013-10-09 ENCOUNTER — Emergency Department (HOSPITAL_COMMUNITY): Payer: Medicare Other

## 2013-10-09 DIAGNOSIS — Z8659 Personal history of other mental and behavioral disorders: Secondary | ICD-10-CM | POA: Insufficient documentation

## 2013-10-09 DIAGNOSIS — R5383 Other fatigue: Secondary | ICD-10-CM

## 2013-10-09 DIAGNOSIS — N898 Other specified noninflammatory disorders of vagina: Secondary | ICD-10-CM | POA: Insufficient documentation

## 2013-10-09 DIAGNOSIS — N9489 Other specified conditions associated with female genital organs and menstrual cycle: Secondary | ICD-10-CM | POA: Insufficient documentation

## 2013-10-09 DIAGNOSIS — M129 Arthropathy, unspecified: Secondary | ICD-10-CM | POA: Insufficient documentation

## 2013-10-09 DIAGNOSIS — R109 Unspecified abdominal pain: Secondary | ICD-10-CM | POA: Insufficient documentation

## 2013-10-09 DIAGNOSIS — Z87891 Personal history of nicotine dependence: Secondary | ICD-10-CM | POA: Insufficient documentation

## 2013-10-09 DIAGNOSIS — Z862 Personal history of diseases of the blood and blood-forming organs and certain disorders involving the immune mechanism: Secondary | ICD-10-CM | POA: Insufficient documentation

## 2013-10-09 DIAGNOSIS — Z3202 Encounter for pregnancy test, result negative: Secondary | ICD-10-CM | POA: Insufficient documentation

## 2013-10-09 DIAGNOSIS — R5381 Other malaise: Secondary | ICD-10-CM | POA: Insufficient documentation

## 2013-10-09 DIAGNOSIS — N939 Abnormal uterine and vaginal bleeding, unspecified: Secondary | ICD-10-CM

## 2013-10-09 LAB — CBC WITH DIFFERENTIAL/PLATELET
Basophils Absolute: 0 10*3/uL (ref 0.0–0.1)
Basophils Relative: 0 % (ref 0–1)
Eosinophils Absolute: 0.1 10*3/uL (ref 0.0–0.7)
Eosinophils Relative: 1 % (ref 0–5)
HCT: 31.3 % — ABNORMAL LOW (ref 36.0–46.0)
HEMOGLOBIN: 10.5 g/dL — AB (ref 12.0–15.0)
Lymphocytes Relative: 13 % (ref 12–46)
Lymphs Abs: 1.2 10*3/uL (ref 0.7–4.0)
MCH: 24.8 pg — AB (ref 26.0–34.0)
MCHC: 33.5 g/dL (ref 30.0–36.0)
MCV: 74 fL — AB (ref 78.0–100.0)
MONO ABS: 0.6 10*3/uL (ref 0.1–1.0)
Monocytes Relative: 6 % (ref 3–12)
NEUTROS ABS: 7.2 10*3/uL (ref 1.7–7.7)
Neutrophils Relative %: 80 % — ABNORMAL HIGH (ref 43–77)
Platelets: 283 10*3/uL (ref 150–400)
RBC: 4.23 MIL/uL (ref 3.87–5.11)
RDW: 13.4 % (ref 11.5–15.5)
WBC: 9 10*3/uL (ref 4.0–10.5)

## 2013-10-09 LAB — COMPREHENSIVE METABOLIC PANEL
ALK PHOS: 46 U/L (ref 39–117)
ALT: 6 U/L (ref 0–35)
AST: 13 U/L (ref 0–37)
Albumin: 4.1 g/dL (ref 3.5–5.2)
BILIRUBIN TOTAL: 0.5 mg/dL (ref 0.3–1.2)
BUN: 6 mg/dL (ref 6–23)
CO2: 23 mEq/L (ref 19–32)
Calcium: 9.5 mg/dL (ref 8.4–10.5)
Chloride: 104 mEq/L (ref 96–112)
Creatinine, Ser: 0.65 mg/dL (ref 0.50–1.10)
GFR calc non Af Amer: >90 mL/min (ref >90)
Glucose, Bld: 107 mg/dL — ABNORMAL HIGH (ref 70–99)
POTASSIUM: 3.6 meq/L — AB (ref 3.7–5.3)
Sodium: 142 mEq/L (ref 137–147)
TOTAL PROTEIN: 7.6 g/dL (ref 6.0–8.3)

## 2013-10-09 LAB — URINALYSIS, ROUTINE W REFLEX MICROSCOPIC
Glucose, UA: NEGATIVE mg/dL
Ketones, ur: NEGATIVE mg/dL
NITRITE: NEGATIVE
Protein, ur: NEGATIVE mg/dL
SPECIFIC GRAVITY, URINE: 1.024 (ref 1.005–1.030)
UROBILINOGEN UA: 1 mg/dL (ref 0.0–1.0)
pH: 6 (ref 5.0–8.0)

## 2013-10-09 LAB — WET PREP, GENITAL
Clue Cells Wet Prep HPF POC: NONE SEEN
TRICH WET PREP: NONE SEEN
YEAST WET PREP: NONE SEEN

## 2013-10-09 LAB — POC URINE PREG, ED: PREG TEST UR: NEGATIVE

## 2013-10-09 LAB — LIPASE, BLOOD: Lipase: 23 U/L (ref 11–59)

## 2013-10-09 LAB — URINE MICROSCOPIC-ADD ON

## 2013-10-09 MED ORDER — SODIUM CHLORIDE 0.9 % IV BOLUS (SEPSIS)
1000.0000 mL | Freq: Once | INTRAVENOUS | Status: AC
  Administered 2013-10-09: 1000 mL via INTRAVENOUS

## 2013-10-09 MED ORDER — ONDANSETRON HCL 4 MG/2ML IJ SOLN
4.0000 mg | Freq: Once | INTRAMUSCULAR | Status: AC
  Administered 2013-10-09: 4 mg via INTRAVENOUS
  Filled 2013-10-09: qty 2

## 2013-10-09 MED ORDER — DIPHENHYDRAMINE HCL 50 MG/ML IJ SOLN
25.0000 mg | Freq: Once | INTRAMUSCULAR | Status: AC
  Administered 2013-10-09: 25 mg via INTRAVENOUS
  Filled 2013-10-09: qty 1

## 2013-10-09 MED ORDER — OXYCODONE-ACETAMINOPHEN 5-325 MG PO TABS: 2.0000 | ORAL_TABLET | ORAL | Status: DC | PRN

## 2013-10-09 MED ORDER — MORPHINE SULFATE 4 MG/ML IJ SOLN
4.0000 mg | Freq: Once | INTRAMUSCULAR | Status: AC
  Administered 2013-10-09: 4 mg via INTRAVENOUS
  Filled 2013-10-09: qty 1

## 2013-10-09 NOTE — ED Notes (Signed)
Patient transported to Ultrasound 

## 2013-10-09 NOTE — ED Provider Notes (Signed)
CSN: [id number]     Arrival date & time 10/09/13  0710 History   First MD Initiated Contact with Patient 10/09/13 0725     Chief Complaint  Patient presents with  . Vaginal Bleeding  . Abdominal Pain     (Consider location/radiation/quality/duration/timing/severity/associated sxs/prior Treatment) HPI Comments: Patient complains of crampy lower abdominal pain has been constant since may 25th. Associated with vaginal bleeding. She states that initially started 2 days or her cycle and then stopped she became worse again when she began bleeding on June fifth. She reports going through 15 tampons yesterday. She denies any vomiting or nausea. No change in bowel habits. No fevers. No dysuria or hematuria. She had a CT scan on May 26 that showed fluid in her colon consistent with colitis and no other abnormalities. She reports a history of ovarian cysts but no fibroids. She denies any dizziness or lightheadedness. Nothing makes the pain better or worse. The pain is diffuse across the lower abdomen worse with palpation.  The history is provided by the patient.    Past Medical History  Diagnosis Date  . Anemia   . Anxiety   . Depression   . Arthritis    Past Surgical History  Procedure Laterality Date  . Pelvic fracture surgery    . Hip fracture surgery    . Coma    . Spleen repair    . Brain injury     Family History  Problem Relation Age of Onset  . Cancer Mother   . Hypertension Mother   . Diabetes Maternal Aunt   . Hypertension Maternal Aunt   . Diabetes Maternal Uncle   . Hypertension Maternal Uncle   . Cancer Maternal Grandmother   . Diabetes Maternal Grandmother   . Hypertension Maternal Grandmother    History  Substance Use Topics  . Smoking status: Former Smoker -- 0.25 packs/day    Types: Cigarettes    Quit date: 07/06/2011  . Smokeless tobacco: Not on file  . Alcohol Use: No   OB History   Grav Para Term Preterm Abortions TAB SAB Ect Mult Living   3 3 3       3       Review of Systems  Constitutional: Negative for fever, activity change and appetite change.  Respiratory: Negative for cough, chest tightness and shortness of breath.   Cardiovascular: Negative for chest pain.  Gastrointestinal: Positive for abdominal pain. Negative for nausea, vomiting, diarrhea and blood in stool.  Genitourinary: Positive for vaginal bleeding. Negative for dysuria.  Musculoskeletal: Negative for arthralgias, back pain and myalgias.  Skin: Negative for rash.  Neurological: Positive for weakness. Negative for dizziness, light-headedness and headaches.  A complete 10 system review of systems was obtained and all systems are negative except as noted in the HPI and PMH.      Allergies  Hydrocodone; Morphine and related; and Zyprexa  Home Medications   Prior to Admission medications   Medication Sig Start Date End Date Taking? Authorizing Provider  ibuprofen (ADVIL,MOTRIN) 200 MG tablet Take 200 mg by mouth every 6 (six) hours as needed for moderate pain.   Yes Historical Provider, MD  oxyCODONE-acetaminophen (PERCOCET/ROXICET) 5-325 MG per tablet Take 2 tablets by mouth every 4 (four) hours as needed for severe pain. 10/09/13   Glynn Octave, MD   BP 114/83  Pulse 79  Temp(Src) 98.2 F (36.8 C) (Oral)  Resp 16  SpO2 100%  LMP 10/03/2013 Physical Exam  Constitutional: She is oriented to person,  place, and time. She appears well-developed and well-nourished. No distress.  HENT:  Head: Normocephalic and atraumatic.  Mouth/Throat: Oropharynx is clear and moist. No oropharyngeal exudate.  Eyes: Conjunctivae and EOM are normal. Pupils are equal, round, and reactive to light.  Neck: Normal range of motion. Neck supple.  Cardiovascular: Normal rate, regular rhythm and normal heart sounds.   No murmur heard. Pulmonary/Chest: Effort normal and breath sounds normal. No respiratory distress.  Abdominal: Soft. There is tenderness. There is no rebound and no guarding.   Lower abdominal tenderness without peritoneal signs no guarding or rebound  Genitourinary:  Chaperone present. Normal external genitalia. Dark blood in vaginal vault without active bleeding. Cervix closed. No CMT or lateralizing adnexal tenderness  Musculoskeletal: She exhibits no edema and no tenderness.  Neurological: She is alert and oriented to person, place, and time. No cranial nerve deficit. She exhibits normal muscle tone. Coordination normal.  Skin: Skin is warm.    ED Course  Procedures (including critical care time) Labs Review Labs Reviewed  WET PREP, GENITAL - Abnormal; Notable for the following:    WBC, Wet Prep HPF POC FEW (*)    All other components within normal limits  URINALYSIS, ROUTINE W REFLEX MICROSCOPIC - Abnormal; Notable for the following:    Color, Urine AMBER (*)    Hgb urine dipstick LARGE (*)    Bilirubin Urine SMALL (*)    Leukocytes, UA TRACE (*)    All other components within normal limits  CBC WITH DIFFERENTIAL - Abnormal; Notable for the following:    Hemoglobin 10.5 (*)    HCT 31.3 (*)    MCV 74.0 (*)    MCH 24.8 (*)    Neutrophils Relative % 80 (*)    All other components within normal limits  COMPREHENSIVE METABOLIC PANEL - Abnormal; Notable for the following:    Potassium 3.6 (*)    Glucose, Bld 107 (*)    All other components within normal limits  GC/CHLAMYDIA PROBE AMP  LIPASE, BLOOD  URINE MICROSCOPIC-ADD ON  POC URINE PREG, ED    Imaging Review US Transvaginal Non-ob  10/09/2013   CLINICAL DATA:  Vaginal bleeding and pelvic pain.  EXAM: TRANSABDOMINAL AND TRANSVAGINAL ULTRASOUND OF PELVIS  TECHNIQUE: Both transabdominal and transvaginal ultrasound examinations of the pelvis were performed. Transabdominal technique was performed for global imaging of the pelvis including uterus, ovaries, adnexal regions, and pelvic cul-de-sac. It was necessary to proceed with endovaginal exam following the transabdominal exam to visualize the ovaries  and endometrium.  COMPARISON:  CT scan 09/23/2013  FINDINGS: Uterus  Measurements: 8.3 x 4.9 x 5.0 cm. No fibroids or other mass visualized.  Endometrium  Thickness: 6 mm.  No focal abnormality visualized.  Right ovary  Measurements: 2.6 x 1.9 x 1.9 cm. Complex cyst associated with the right ovary measures 1.5 x 1.2 x 1.8 cm. This is most likely a hemorrhagic cyst. It does not appear to be significantly changed since the prior CT scan. Patent intra-ovarian blood flow.  Left ovary  Measurements: 2.6 x 2.0 x 2.6 cm. Adjacent to the left ovary is a complex mass measuring 4.7 x 3.3 x 3.4 cm. It is difficult to appreciate this on the prior CT scan. Internal blood flow is noted. The patient was tender over this area. Patent intra-ovarian blood flow.  Other findings  Trace free pelvic fluid.  IMPRESSION: 1. Normal sonographic appearance of the uterus. 2. Small probable hemorrhagic cyst associate with the right ovary. 3. Complex "Mass" adjacent  to the left ovary. I cannot identify this on the prior recent CT scan from 09/23/2013 making it more likely to be an inflammatory or infectious process. MRI pelvis without and with contrast may be helpful for further evaluation.   Electronically Signed   By: Loralie Champagne M.D.   On: 10/09/2013 09:22   US Pelvis Complete  10/09/2013   CLINICAL DATA:  Vaginal bleeding and abdominal pain  EXAM: TRANSABDOMINAL AND TRANSVAGINAL ULTRASOUND OF PELVIS  DOPPLER ULTRASOUND OF OVARIES  TECHNIQUE: Both transabdominal and transvaginal ultrasound examinations of the pelvis were performed. Transabdominal technique was performed for global imaging of the pelvis including uterus, ovaries, adnexal regions, and pelvic cul-de-sac.  It was necessary to proceed with endovaginal exam following the transabdominal exam to visualize the ovaries. Color and duplex Doppler ultrasound was utilized to evaluate blood flow to the ovaries.  COMPARISON:  None.  FINDINGS: Uterus  Measurements: 8.3 x 4.9 x 5.0 cm.  No fibroids or other mass visualized.  Endometrium  Thickness: 6 mm.  No focal abnormality visualized.  Right ovary  Measurements: 2.6 x 1.9 x 1.9 cm. A complex ovarian cystic structure measuring 1.5 x 1.2 x 1.8 cm is demonstrated. It is not hypervascular.  Left ovary  Measurements: 2.6 x 2.0 x 2.6 cm. Adjacent and superior to the the left ovary there is a mixed echogenicity irregularly marginated mass measuring 4.7 x 3.3 x 3.4 cm. It demonstrates increased vascularity and is tender to palpation.  Pulsed Doppler evaluation of both ovaries demonstrates normal low-resistance arterial and venous waveforms. There are no findings suspicious for ovarian torsion.  Other findings  There is a trace of free pelvic fluid.  IMPRESSION: 1. There is a tender, hypervascular, complex left adnexal mass. A tubo-ovarian abscess, hemorrhagic cyst, or endometrioma are the post likely etiologies. Ectopic pregnancy is not excluded. Correlation with the patient's white blood cell count and beta HCG is needed. 2. There is a complex right adnexal cystic structure measuring 1.8 cm in maximal dimension likely reflecting a hemorrhagic cyst. 3. The uterus is normal.   Electronically Signed   By: David  [place]   On: 10/09/2013 09:06   [place] Art/ven Flow Abd Pelv Doppler  10/09/2013   CLINICAL DATA:  Vaginal bleeding and abdominal pain  EXAM: TRANSABDOMINAL AND TRANSVAGINAL ULTRASOUND OF PELVIS  DOPPLER ULTRASOUND OF OVARIES  TECHNIQUE: Both transabdominal and transvaginal ultrasound examinations of the pelvis were performed. Transabdominal technique was performed for global imaging of the pelvis including uterus, ovaries, adnexal regions, and pelvic cul-de-sac.  It was necessary to proceed with endovaginal exam following the transabdominal exam to visualize the ovaries. Color and duplex Doppler ultrasound was utilized to evaluate blood flow to the ovaries.  COMPARISON:  None.  FINDINGS: Uterus  Measurements: 8.3 x 4.9 x 5.0 cm. No fibroids or  other mass visualized.  Endometrium  Thickness: 6 mm.  No focal abnormality visualized.  Right ovary  Measurements: 2.6 x 1.9 x 1.9 cm. A complex ovarian cystic structure measuring 1.5 x 1.2 x 1.8 cm is demonstrated. It is not hypervascular.  Left ovary  Measurements: 2.6 x 2.0 x 2.6 cm. Adjacent and superior to the the left ovary there is a mixed echogenicity irregularly marginated mass measuring 4.7 x 3.3 x 3.4 cm. It demonstrates increased vascularity and is tender to palpation.  Pulsed Doppler evaluation of both ovaries demonstrates normal low-resistance arterial and venous waveforms. There are no findings suspicious for ovarian torsion.  Other findings  There is a trace of free  pelvic fluid.  IMPRESSION: 1. There is a tender, hypervascular, complex left adnexal mass. A tubo-ovarian abscess, hemorrhagic cyst, or endometrioma are the post likely etiologies. Ectopic pregnancy is not excluded. Correlation with the patient's white blood cell count and beta HCG is needed. 2. There is a complex right adnexal cystic structure measuring 1.8 cm in maximal dimension likely reflecting a hemorrhagic cyst. 3. The uterus is normal.   Electronically Signed   By: David  [place]   On: 10/09/2013 09:06     EKG Interpretation None      MDM   Final diagnoses:  Adnexal mass  Vaginal bleeding   Lower abdominal pain with vaginal bleeding for the past 2 weeks. Vital stable. No distress. No peritoneal signs.  Pregnancy test negative Hemoglobin 10.5. Previous range 8-11. No indication for transfusion. She is not orthostatic.  Ultrasound results discussed with Dr. Tamela OddiJackson/Moore. She feels the patient likely has pain from ovarian cyst or endometrioma. Her pregnancy test is negative therefore ectopic is essentially ruled out. No evidence of torsion. Dr. Tamela OddiJackson/moore. doubts tubo-ovarian abscesses patient has no leukocytosis, no fever and appears nontoxic. She recommends followup in the office this week.  Patient's pain  is controlled in the ED. She's had no vomiting. She is tolerating by mouth. She understands the need for followup with gynecology this week. She'll return with worsening pain, bleeding, dizziness lightheadedness, chest pain, shortness of breath or any other concerns.  BP 114/83  Pulse 79  Temp(Src) 98.2 F (36.8 C) (Oral)  Resp 16  SpO2 100%  LMP 10/03/2013   Glynn OctaveStephen Yovan Leeman, MD 10/09/13 1730

## 2013-10-09 NOTE — Discharge Instructions (Signed)
Menorrhagia Followup with Dr. Clearance Coots regarding the mass on her left ovary. Return to the ED if you develop worsening pain, bleeding, weakness, dizziness or lightheadedness. Menorrhagia is the medical term for when your menstrual periods are heavy or last longer than usual. With menorrhagia, every period you have may cause enough blood loss and cramping that you are unable to maintain your usual activities. CAUSES  In some cases, the cause of heavy periods is unknown, but a number of conditions may cause menorrhagia. Common causes include:  A problem with the hormone-producing thyroid gland (hypothyroid).  Noncancerous growths in the uterus (polyps or fibroids).  An imbalance of the estrogen and progesterone hormones.  One of your ovaries not releasing an egg during one or more months.  Side effects of having an intrauterine device (IUD).  Side effects of some medicines, such as anti-inflammatory medicines or blood thinners.  A bleeding disorder that stops your blood from clotting normally. SIGNS AND SYMPTOMS  During a normal period, bleeding lasts between 4 and 8 days. Signs that your periods are too heavy include:  You routinely have to change your pad or tampon every 1 or 2 hours because it is completely soaked.  You pass blood clots larger than 1 inch (2.5 cm) in size.  You have bleeding for more than 7 days.  You need to use pads and tampons at the same time because of heavy bleeding.  You need to wake up to change your pads or tampons during the night.  You have symptoms of anemia, such as tiredness, fatigue, or shortness of breath. DIAGNOSIS  Your health care provider will perform a physical exam and ask you questions about your symptoms and menstrual history. Other tests may be ordered based on what the health care provider finds during the exam. These tests can include:  Blood tests To check if you are pregnant or have hormonal changes, a bleeding or thyroid disorder, low  iron levels (anemia), or other problems.  Endometrial biopsy Your health care provider takes a sample of tissue from the inside of your uterus to be examined under a microscope.  Pelvic ultrasound This test uses sound waves to make a picture of your uterus, ovaries, and vagina. The pictures can show if you have fibroids or other growths.  Hysteroscopy For this test, your health care provider will use a small telescope to look inside your uterus. Based on the results of your initial tests, your health care provider may recommend further testing. TREATMENT  Treatment may not be needed. If it is needed, your health care provider may recommend treatment with one or more medicines first. If these do not reduce bleeding enough, a surgical treatment might be an option. The best treatment for you will depend on:   Whether you need to prevent pregnancy.  Your desire to have children in the future.  The cause and severity of your bleeding.  Your opinion and personal preference.  Medicines for menorrhagia may include:  Birth control methods that use hormones These include the pill, skin patch, vaginal ring, shots that you get every 3 months, hormonal IUD, and implant. These treatments reduce bleeding during your menstrual period.  Medicines that thicken blood and slow bleeding.  Medicines that reduce swelling, such as ibuprofen.  Medicines that contain a synthetic hormone called progestin.   Medicines that make the ovaries stop working for a short time.  You may need surgical treatment for menorrhagia if the medicines are unsuccessful. Treatment options include:  Dilation  and curettage (D&C) In this procedure, your health care provider opens (dilates) your cervix and then scrapes or suctions tissue from the lining of your uterus to reduce menstrual bleeding.  Operative hysteroscopy This procedure uses a tiny tube with a light (hysteroscope) to view your uterine cavity and can help in the  surgical removal of a polyp that may be causing heavy periods.  Endometrial ablation Through various techniques, your health care provider permanently destroys the entire lining of your uterus (endometrium). After endometrial ablation, most women have little or no menstrual flow. Endometrial ablation reduces your ability to become pregnant.  Endometrial resection This surgical procedure uses an electrosurgical wire loop to remove the lining of the uterus. This procedure also reduces your ability to become pregnant.  Hysterectomy Surgical removal of the uterus and cervix is a permanent procedure that stops menstrual periods. Pregnancy is not possible after a hysterectomy. This procedure requires anesthesia and hospitalization. HOME CARE INSTRUCTIONS   Only take over-the-counter or prescription medicines as directed by your health care provider. Take prescribed medicines exactly as directed. Do not change or switch medicines without consulting your health care provider.  Take any prescribed iron pills exactly as directed by your health care provider. Long-term heavy bleeding may result in low iron levels. Iron pills help replace the iron your body lost from heavy bleeding. Iron may cause constipation. If this becomes a problem, increase the bran, fruits, and roughage in your diet.  Do not take aspirin or medicines that contain aspirin 1 week before or during your menstrual period. Aspirin may make the bleeding worse.  If you need to change your sanitary pad or tampon more than once every 2 hours, stay in bed and rest as much as possible until the bleeding stops.  Eat well-balanced meals. Eat foods high in iron. Examples are leafy green vegetables, meat, liver, eggs, and whole grain breads and cereals. Do not try to lose weight until the abnormal bleeding has stopped and your blood iron level is back to normal. SEEK MEDICAL CARE IF:   You soak through a pad or tampon every 1 or 2 hours, and this  happens every time you have a period.  You need to use pads and tampons at the same time because you are bleeding so much.  You need to change your pad or tampon during the night.  You have a period that lasts for more than 8 days.  You pass clots bigger than 1 inch wide.  You have irregular periods that happen more or less often than once a month.  You feel dizzy or faint.  You feel very weak or tired.  You feel short of breath or feel your heart is beating too fast when you exercise.  You have nausea and vomiting or diarrhea while you are taking your medicine.  You have any problems that may be related to the medicine you are taking. SEEK IMMEDIATE MEDICAL CARE IF:   You soak through 4 or more pads or tampons in 2 hours.  You have any bleeding while you are pregnant. MAKE SURE YOU:   Understand these instructions.  Will watch your condition.  Will get help right away if you are not doing well or get worse. Document Released: 04/17/2005 Document Revised: 02/05/2013 Document Reviewed: 10/06/2012 [facility] Patient Information 2014 [place].

## 2013-10-09 NOTE — ED Notes (Signed)
Pt states that she does not have bad periods but was here on 5/26 for heavy vaginal bleeding.  States that it stopped but started back again on 6/5.  C/o low abd pain and going through a box of tampons in 1 day.

## 2013-10-09 NOTE — ED Notes (Signed)
MD at bedside. 

## 2013-10-10 ENCOUNTER — Ambulatory Visit (INDEPENDENT_AMBULATORY_CARE_PROVIDER_SITE_OTHER): Payer: Medicare Other | Admitting: Advanced Practice Midwife

## 2013-10-10 ENCOUNTER — Encounter: Payer: Self-pay | Admitting: Advanced Practice Midwife

## 2013-10-10 VITALS — BP 115/74 | HR 66 | Temp 98.2°F | Resp 18

## 2013-10-10 DIAGNOSIS — R1031 Right lower quadrant pain: Secondary | ICD-10-CM

## 2013-10-10 LAB — GC/CHLAMYDIA PROBE AMP
CT Probe RNA: NEGATIVE
GC PROBE AMP APTIMA: POSITIVE — AB

## 2013-10-10 MED ORDER — IBUPROFEN 600 MG PO TABS: 600.0000 mg | ORAL_TABLET | Freq: Four times a day (QID) | ORAL | Status: DC | PRN

## 2013-10-10 MED ORDER — OXYCODONE-ACETAMINOPHEN 5-325 MG PO TABS
1.0000 | ORAL_TABLET | ORAL | Status: DC | PRN
Start: 2013-10-10 — End: 2013-10-13

## 2013-10-10 NOTE — Progress Notes (Signed)
Patient rescheduled on MD schedule early Monday morning.  Reviewed signs of infection and when to present to [facility], reviewed emergency signs and symptoms. Filed Vitals:   10/10/13 1621  BP: 115/74  Pulse: 66  Temp: 98.2 F (36.8 C)  Resp: 18  Gave patient ibuprofen and percocet for the weekend.   Rolly Magri Wilson SingerWren CNM

## 2013-10-11 ENCOUNTER — Telehealth (HOSPITAL_BASED_OUTPATIENT_CLINIC_OR_DEPARTMENT_OTHER): Payer: Self-pay | Admitting: Emergency Medicine

## 2013-10-11 NOTE — Telephone Encounter (Signed)
+  Gonorrhea. Chart sent to EDP office for review. [facility]HS attached. °

## 2013-10-13 ENCOUNTER — Ambulatory Visit (INDEPENDENT_AMBULATORY_CARE_PROVIDER_SITE_OTHER): Payer: Medicare Other | Admitting: Obstetrics & Gynecology

## 2013-10-13 ENCOUNTER — Encounter: Payer: Self-pay | Admitting: Obstetrics & Gynecology

## 2013-10-13 ENCOUNTER — Encounter (HOSPITAL_COMMUNITY): Payer: Self-pay | Admitting: *Deleted

## 2013-10-13 ENCOUNTER — Inpatient Hospital Stay (HOSPITAL_COMMUNITY)
Admission: AD | Admit: 2013-10-13 | Discharge: 2013-10-17 | DRG: 758 | Disposition: A | Discharge status: Home / Self Care | Payer: Medicare Other | Source: Ambulatory Visit | Attending: Obstetrics & Gynecology | Admitting: Obstetrics & Gynecology

## 2013-10-13 DIAGNOSIS — Z87891 Personal history of nicotine dependence: Secondary | ICD-10-CM

## 2013-10-13 DIAGNOSIS — N949 Unspecified condition associated with female genital organs and menstrual cycle: Secondary | ICD-10-CM

## 2013-10-13 DIAGNOSIS — N730 Acute parametritis and pelvic cellulitis: Principal | ICD-10-CM | POA: Diagnosis present

## 2013-10-13 DIAGNOSIS — A5400 Gonococcal infection of lower genitourinary tract, unspecified: Secondary | ICD-10-CM | POA: Diagnosis present

## 2013-10-13 DIAGNOSIS — R102 Pelvic and perineal pain: Secondary | ICD-10-CM

## 2013-10-13 LAB — CBC WITH DIFFERENTIAL/PLATELET
Basophils Absolute: 0 10*3/uL (ref 0.0–0.1)
Basophils Relative: 0 % (ref 0–1)
Eosinophils Absolute: 0 10*3/uL (ref 0.0–0.7)
Eosinophils Relative: 0 % (ref 0–5)
HCT: 29.5 % — ABNORMAL LOW (ref 36.0–46.0)
Hemoglobin: 9.5 g/dL — ABNORMAL LOW (ref 12.0–15.0)
LYMPHS ABS: 1.7 10*3/uL (ref 0.7–4.0)
LYMPHS PCT: 18 % (ref 12–46)
MCH: 24.6 pg — ABNORMAL LOW (ref 26.0–34.0)
MCHC: 32.2 g/dL (ref 30.0–36.0)
MCV: 76.4 fL — AB (ref 78.0–100.0)
Monocytes Absolute: 0.4 10*3/uL (ref 0.1–1.0)
Monocytes Relative: 5 % (ref 3–12)
NEUTROS ABS: 7.3 10*3/uL (ref 1.7–7.7)
Neutrophils Relative %: 77 % (ref 43–77)
PLATELETS: 273 10*3/uL (ref 150–400)
RBC: 3.86 MIL/uL — AB (ref 3.87–5.11)
RDW: 13.6 % (ref 11.5–15.5)
WBC: 9.5 10*3/uL (ref 4.0–10.5)

## 2013-10-13 LAB — COMPREHENSIVE METABOLIC PANEL
ALBUMIN: 3.6 g/dL (ref 3.5–5.2)
ALK PHOS: 51 U/L (ref 39–117)
ALT: 5 U/L (ref 0–35)
AST: 12 U/L (ref 0–37)
BUN: 5 mg/dL — AB (ref 6–23)
CO2: 25 mEq/L (ref 19–32)
CREATININE: 0.68 mg/dL (ref 0.50–1.10)
Calcium: 9.4 mg/dL (ref 8.4–10.5)
Chloride: 104 mEq/L (ref 96–112)
GFR calc non Af Amer: >90 mL/min (ref >90)
GLUCOSE: 88 mg/dL (ref 70–99)
Potassium: 4.1 mEq/L (ref 3.7–5.3)
SODIUM: 140 meq/L (ref 137–147)
Total Bilirubin: <0.2 mg/dL — ABNORMAL LOW (ref 0.3–1.2)
Total Protein: 7.2 g/dL (ref 6.0–8.3)

## 2013-10-13 LAB — HIV ANTIBODY (ROUTINE TESTING W REFLEX): HIV: NONREACTIVE

## 2013-10-13 LAB — POCT URINE PREGNANCY: PREG TEST UR: NEGATIVE

## 2013-10-13 LAB — RPR

## 2013-10-13 LAB — HCG, SERUM, QUALITATIVE: PREG SERUM: NEGATIVE

## 2013-10-13 LAB — HEPATITIS B SURFACE ANTIGEN: HEP B S AG: NEGATIVE

## 2013-10-13 MED ORDER — ONDANSETRON HCL 4 MG/2ML IJ SOLN: 4.0000 mg | Freq: Four times a day (QID) | INTRAMUSCULAR | Status: DC | PRN

## 2013-10-13 MED ORDER — GENTAMICIN SULFATE 40 MG/ML IJ SOLN
Freq: Three times a day (TID) | INTRAVENOUS | Status: DC
  Administered 2013-10-13 – 2013-10-16 (×10): via INTRAVENOUS
  Filled 2013-10-13 (×11): qty 3

## 2013-10-13 MED ORDER — ACETAMINOPHEN 500 MG PO TABS
1000.0000 mg | ORAL_TABLET | Freq: Four times a day (QID) | ORAL | Status: DC
  Administered 2013-10-13 – 2013-10-14 (×4): 1000 mg via ORAL
  Filled 2013-10-13 (×5): qty 2

## 2013-10-13 MED ORDER — ONDANSETRON HCL 4 MG PO TABS: 4.0000 mg | ORAL_TABLET | Freq: Four times a day (QID) | ORAL | Status: DC | PRN

## 2013-10-13 MED ORDER — GENTAMICIN SULFATE 40 MG/ML IJ SOLN
Freq: Three times a day (TID) | INTRAVENOUS | Status: DC
  Filled 2013-10-13: qty 177.25

## 2013-10-13 MED ORDER — OXYCODONE HCL 5 MG PO TABS: 10.0000 mg | ORAL_TABLET | Freq: Four times a day (QID) | ORAL | Status: DC | PRN

## 2013-10-13 MED ORDER — SIMETHICONE 80 MG PO CHEW: 80.0000 mg | CHEWABLE_TABLET | Freq: Four times a day (QID) | ORAL | Status: DC | PRN

## 2013-10-13 MED ORDER — DEXTROSE 5 % IV SOLN: 900.0000 mg | Freq: Three times a day (TID) | INTRAVENOUS | Status: DC

## 2013-10-13 MED ORDER — SODIUM CHLORIDE 0.9 % IV SOLN: 250.0000 mL | INTRAVENOUS | Status: DC | PRN

## 2013-10-13 MED ORDER — SODIUM CHLORIDE 0.9 % IJ SOLN
3.0000 mL | Freq: Two times a day (BID) | INTRAMUSCULAR | Status: DC
  Administered 2013-10-13: 3 mL via INTRAVENOUS

## 2013-10-13 MED ORDER — OXYCODONE HCL 5 MG PO TABS
10.0000 mg | ORAL_TABLET | Freq: Four times a day (QID) | ORAL | Status: DC | PRN
  Administered 2013-10-13 – 2013-10-15 (×8): 10 mg via ORAL
  Filled 2013-10-13 (×8): qty 2

## 2013-10-13 MED ORDER — ACETAMINOPHEN 325 MG PO TABS
1000.0000 mg | ORAL_TABLET | Freq: Four times a day (QID) | ORAL | Status: DC
Start: 2013-10-13 — End: 2013-10-17

## 2013-10-13 MED ORDER — SODIUM CHLORIDE 0.9 % IJ SOLN: 3.0000 mL | INTRAMUSCULAR | Status: DC | PRN

## 2013-10-13 MED ORDER — IBUPROFEN 200 MG PO TABS: 600.0000 mg | ORAL_TABLET | Freq: Four times a day (QID) | ORAL | Status: DC

## 2013-10-13 MED ORDER — CLINDAMYCIN PHOSPHATE 900 MG/50ML IV SOLN: 900.0000 mg | Freq: Three times a day (TID) | INTRAVENOUS | Status: DC

## 2013-10-13 MED ORDER — IBUPROFEN 600 MG PO TABS
600.0000 mg | ORAL_TABLET | Freq: Four times a day (QID) | ORAL | Status: DC
  Administered 2013-10-13 – 2013-10-15 (×8): 600 mg via ORAL
  Filled 2013-10-13 (×10): qty 1

## 2013-10-13 NOTE — Progress Notes (Signed)
ANTIBIOTIC CONSULT NOTE - INITIAL  Pharmacy Consult for Gentamicin Indication: PID  Allergies  Allergen Reactions  . Hydrocodone Itching    Ok to take with benadryl  . Morphine And Related Itching    Ok to take with benadryl  . Zyprexa [Olanzapine] Other (See Comments)    hallucinations    Patient Measurements: Height: 5\' 3"  (160 cm) Weight: 130 lb 4 oz (59.081 kg) IBW/kg (Calculated) : 52.4 kg Dosing Weight: 59 kg  Vital Signs: Temp: 98.4 F (36.9 C) (06/15 1140) Temp src: Oral (06/15 1140) BP: 122/73 mmHg (06/15 1140) Pulse Rate: 90 (06/15 1140)  Labs:  Recent Labs  10/13/13 1236  WBC 9.5  HGB 9.5*  PLT 273   SCr 0.65 on 6/11 with estimated CrCl of 100 ml/min    Microbiology: Recent Results (from the past 720 hour(s))  URINE CULTURE     Status: None   Collection Time    09/23/13  1:18 PM      Result Value Ref Range Status   Specimen Description URINE, CLEAN CATCH   Final   Special Requests NONE   Final   Culture  Setup Time     Final   Value: 09/23/2013 21:27     [facility]   Colony Count     Final   Value: >=100,000 COLONIES/ML     [facility]   Culture     Final   Value: Multiple bacterial morphotypes present, none predominant. Suggest appropriate recollection if clinically indicated.     [facility]formed at [facility]   Report Status 09/24/2013 FINAL   Final  WET PREP, GENITAL     Status: Abnormal   Collection Time    10/09/13  7:40 AM      Result Value Ref Range Status   Yeast Wet Prep HPF POC NONE SEEN  NONE SEEN Final   Trich, Wet Prep NONE SEEN  NONE SEEN Final   Clue Cells Wet Prep HPF POC NONE SEEN  NONE SEEN Final   WBC, Wet Prep HPF POC FEW (*) NONE SEEN Final  GC/CHLAMYDIA PROBE AMP     Status: Abnormal   Collection Time    10/09/13  7:40 AM      Result Value Ref Range Status   CT Probe RNA NEGATIVE  NEGATIVE Final   GC Probe RNA POSITIVE (*) NEGATIVE Final   Comment: (NOTE)     A  Positive CT or NG Nucleic Acid Amplification Test (NAAT) result     should be considered presumptive evidence of infection.  The result     should be evaluated along with physical examination and other     diagnostic findings.                                                                                               **Normal Reference Range: Negative**          Assay performed using the Gen-Probe APTIMA COMBO2 (R) Assay.     Acceptable specimen types for this assay include APTIMA Swabs (Unisex,     endocervical,  urethral, or vaginal), first void urine, and ThinPrep     liquid based cytology samples.     Performed at [facility]    Medications:  Clindamycin 900 mg IV Q 8 hr  Assessment: 33 y.o. female with acute PID or possible tubo ovarian complex Estimated Ke = 0.3, Vd = 0.3 L/kg  Goal of Therapy:  Gentamicin peak 6-8 mg/L and Trough < 1 mg/L  Plan:  Gentamicin 120 mg IV every 8 hrs Check Scr with next labs if gentamicin continued. Will check gentamicin levels if continued > 72hr or clinically indicated.  Angela Andrade, Angela Andrade 10/13/2013,1:12 PM

## 2013-10-13 NOTE — Progress Notes (Signed)
Informed patient significant other needs to get OOB in to the sofa bed not in patient's bed.

## 2013-10-13 NOTE — H&P (Signed)
Angela Andrade is an 33 y.o. female. Seen in the office today and diagnosed with acute PID, possible tubo ovarian complex.  She is admitted for inpatient management of PID.  She presented to the ED several days ago with abdominal pain.  A pelvic U/S was suggestive of an inflammatory process in the left adnexa.  A GC probe was positive.  Pertinent Gynecological History:  Sexually transmitted diseases: past history: chlamydia    Menstrual History:  Patient's last menstrual period was 10/03/2013.    Past Medical History  Diagnosis Date  . Anemia   . Anxiety   . Depression   . Arthritis     Past Surgical History  Procedure Laterality Date  . Pelvic fracture surgery    . Hip fracture surgery    . Coma    . Spleen repair    . Brain injury      Family History  Problem Relation Age of Onset  . Cancer Mother   . Hypertension Mother   . Diabetes Maternal Aunt   . Hypertension Maternal Aunt   . Diabetes Maternal Uncle   . Hypertension Maternal Uncle   . Cancer Maternal Grandmother   . Diabetes Maternal Grandmother   . Hypertension Maternal Grandmother     Social History:  reports that she quit smoking about 2 years ago. Her smoking use included Cigarettes. She smoked 0.25 packs per day. She does not have any smokeless tobacco history on file. She reports that she does not drink alcohol or use illicit drugs.  Allergies:  Allergies  Allergen Reactions  . Hydrocodone Itching    Ok to take with benadryl  . Morphine And Related Itching    Ok to take with benadryl  . Zyprexa [Olanzapine] Other (See Comments)    hallucinations    Facility-administered medications prior to admission  Medication Dose Route Frequency Provider Last Rate Last Dose  . acetaminophen (TYLENOL) tablet 975 mg  975 mg Oral QID Antionette CharLisa [name], MD      . clindamycin (CLEOCIN) 900 mg in dextrose 5 % 50 mL IVPB  900 mg Intravenous 3 times per day Antionette CharLisa [name], MD      . ibuprofen (ADVIL,MOTRIN)  tablet 600 mg  600 mg Oral QID Antionette CharLisa [name], MD      . oxyCODONE (Oxy IR/ROXICODONE) immediate release tablet 10 mg  10 mg Oral Q6H PRN Antionette CharLisa [name], MD      . simethicone [facility](MYLICON) chewable tablet 80 mg  80 mg Oral QID PRN Antionette CharLisa [name], MD       Prescriptions prior to admission  Medication Sig Dispense Refill  . ibuprofen (ADVIL,MOTRIN) 200 MG tablet Take 200 mg by mouth every 6 (six) hours as needed for moderate pain.      Marland Kitchen. ibuprofen (ADVIL,MOTRIN) 600 MG tablet Take 1 tablet (600 mg total) by mouth every 6 (six) hours as needed.  30 tablet  0  . oxyCODONE-acetaminophen (PERCOCET/ROXICET) 5-325 MG per tablet Take 2 tablets by mouth every 4 (four) hours as needed for severe pain.  15 tablet  0  . oxyCODONE-acetaminophen (PERCOCET/ROXICET) 5-325 MG per tablet Take 1 tablet by mouth every 4 (four) hours as needed for severe pain.  20 tablet  0    Review of Systems  Constitutional: Negative for fever.  Eyes: Negative for blurred vision.  Respiratory: Negative for shortness of breath.   Gastrointestinal: Positive for abdominal pain. Negative for nausea and vomiting.  Skin: Negative for rash.  Neurological: Negative for headaches.  Last menstrual period 10/03/2013. Physical Exam  Constitutional: She appears well-developed.  HENT:  Head: Normocephalic.  Neck: Neck supple. No thyromegaly present.  Cardiovascular: Normal rate and regular rhythm.   Respiratory: Breath sounds normal.  GI: Soft. Bowel sounds are normal. She exhibits no mass. There is tenderness. There is no rebound.  Genitourinary: Uterus normal. Cervix exhibits motion tenderness. Right adnexum displays tenderness. Right adnexum displays no mass. Left adnexum displays tenderness and fullness. There is bleeding around the vagina.  Skin: No rash noted.    Results for orders placed in visit on 10/13/13 (from the past 24 hour(s))  POCT URINE PREGNANCY     Status: None   Collection Time    10/13/13 10:07 AM       Result Value Ref Range   Preg Test, Ur Negative       Assessment/Plan: Acute PID, possible tubo-ovarian complex  Admit  Gentamicin/Clindamycin Serial exams STI testing  [name],Broadus Costilla A 10/13/2013, 11:34 AM

## 2013-10-13 NOTE — Progress Notes (Signed)
Patient ID: Angela HammockLamia S Andrade, female   DOB: 04/02/1981, 33 y.o.   MRN: [id number]  Chief Complaint  Patient presents with  . Pelvic Pain    HPI Angela Jinny SandersS Schindel is a 33 y.o. female.  She recently presented to the ED with similar complaints.  A CT scan was negative, however a pelvic U/S was suggestive of an inflammatory area in the left adnexa. Normal doppler flow.  A WBC was normal.  There is a h/o chlamydia.  HPI  Past Medical History  Diagnosis Date  . Anemia   . Anxiety   . Depression   . Arthritis     Past Surgical History  Procedure Laterality Date  . Pelvic fracture surgery    . Hip fracture surgery    . Coma    . Spleen repair    . Brain injury      Family History  Problem Relation Age of Onset  . Cancer Mother   . Hypertension Mother   . Diabetes Maternal Aunt   . Hypertension Maternal Aunt   . Diabetes Maternal Uncle   . Hypertension Maternal Uncle   . Cancer Maternal Grandmother   . Diabetes Maternal Grandmother   . Hypertension Maternal Grandmother     Social History History  Substance Use Topics  . Smoking status: Former Smoker -- 0.25 packs/day    Types: Cigarettes    Quit date: 07/06/2011  . Smokeless tobacco: Not on file  . Alcohol Use: No    Allergies  Allergen Reactions  . Hydrocodone Itching    Ok to take with benadryl  . Morphine And Related Itching    Ok to take with benadryl  . Zyprexa [Olanzapine] Other (See Comments)    hallucinations    Current Outpatient Prescriptions  Medication Sig Dispense Refill  . ibuprofen (ADVIL,MOTRIN) 200 MG tablet Take 200 mg by mouth every 6 (six) hours as needed for moderate pain.      Marland Kitchen. ibuprofen (ADVIL,MOTRIN) 600 MG tablet Take 1 tablet (600 mg total) by mouth every 6 (six) hours as needed.  30 tablet  0  . oxyCODONE-acetaminophen (PERCOCET/ROXICET) 5-325 MG per tablet Take 2 tablets by mouth every 4 (four) hours as needed for severe pain.  15 tablet  0  . oxyCODONE-acetaminophen (PERCOCET/ROXICET) 5-325  MG per tablet Take 1 tablet by mouth every 4 (four) hours as needed for severe pain.  20 tablet  0   Current Facility-Administered Medications  Medication Dose Route Frequency Provider Last Rate Last Dose  . acetaminophen (TYLENOL) tablet 975 mg  975 mg Oral QID Antionette CharLisa [name], MD      . clindamycin (CLEOCIN) 900 mg in dextrose 5 % 50 mL IVPB  900 mg Intravenous 3 times per day Antionette CharLisa [name], MD      . ibuprofen (ADVIL,MOTRIN) tablet 600 mg  600 mg Oral QID Antionette CharLisa [name], MD      . oxyCODONE (Oxy IR/ROXICODONE) immediate release tablet 10 mg  10 mg Oral Q6H PRN Antionette CharLisa [name], MD      . simethicone [facility](MYLICON) chewable tablet 80 mg  80 mg Oral QID PRN Antionette CharLisa [name], MD        Review of Systems Review of Systems Constitutional: negative for fatigue and weight loss Respiratory: negative for cough and wheezing Cardiovascular: negative for chest pain, fatigue and palpitations Gastrointestinal: positive for abdominal pain; no change in bowel habits Genitourinary:negative for abnormal discharge Integument/breast: negative for nipple discharge Musculoskeletal:negative for myalgias Neurological: negative for gait problems and tremors Behavioral/Psych:  negative for abusive relationship, depression Endocrine: negative for temperature intolerance     Last menstrual period 10/03/2013.  Physical Exam Physical Exam General:   alert  Skin:   no rash or abnormalities  Lungs:   clear to auscultation bilaterally  Heart:   regular rate and rhythm, S1, S2 normal, no murmur, click, rub or gallop  Breasts:   normal without suspicious masses, skin or nipple changes or axillary nodes  Abdomen:  mild tenderness, suprapubic; no rebound/guarding  Pelvis:  External genitalia: normal general appearance Urinary system: urethral meatus normal and bladder without fullness, nontender Vaginal: scant heme Cervix: normal appearance; positive CMT Adnexa: tender--L>R; fullness on left Uterus:  anteverted and non-tender, normal size      Data Reviewed Labs/radiologic studies  Assessment    PID/Tubo ovarian complex    Plan   Admit for inpatient management of PID Orders Placed This Encounter  Procedures  . CBC with Differential    Standing Status: Standing     Number of Occurrences: 1     Standing Expiration Date:   . hCG, serum, qualitative    Standing Status: Standing     Number of Occurrences: 1     Standing Expiration Date:   . Comprehensive metabolic panel    Standing Status: Standing     Number of Occurrences: 1     Standing Expiration Date:   . HIV antibody    Standing Status: Standing     Number of Occurrences: 1     Standing Expiration Date:   . RPR    Standing Status: Standing     Number of Occurrences: 1     Standing Expiration Date:   . Hepatitis B surface antigen    Standing Status: Standing     Number of Occurrences: 1     Standing Expiration Date:   . Diet regular    Standing Status: Standing     Number of Occurrences: 1     Standing Expiration Date:     Order Specific Question:  Room service appropriate?    Answer:  Yes    Order Specific Question:  Fluid consistency:    Answer:  Thin  . Vital signs    Standing Status: Standing     Number of Occurrences: 34     Standing Expiration Date:   . Notify physician    For temperature greater than 101.0 F, urinary output less than 120 ml in four hours, systolic BP less than 90 or greater than 180, diastolic BP less than 60 or greater than 90.    Standing Status: Standing     Number of Occurrences: 20     Standing Expiration Date:   . Up as tolerated    Standing Status: Standing     Number of Occurrences: 1     Standing Expiration Date:   . Discontinue PCA when pt. tolerating regular diet    Standing Status: Standing     Number of Occurrences: 1     Standing Expiration Date:   . SCDs    Standing Status: Standing     Number of Occurrences: 1     Standing Expiration Date:     Order  Specific Question:  Laterality    Answer:  Bilateral  . Full code    Standing Status: Standing     Number of Occurrences: 1     Standing Expiration Date:   . gentamicin per pharmacy consult    Standing Status: Standing  Number of Occurrences: 1     Standing Expiration Date:     Order Specific Question:  Indication:    Answer:  Treatment    Order Specific Question:  Site of infection:    Answer:  PID  . POCT urine pregnancy  . Saline lock IV    Standing Status: Standing     Number of Occurrences: 1     Standing Expiration Date:   . Admit to Inpatient (patient's expected length of stay will be greater than 2 midnights)    Standing Status: Standing     Number of Occurrences: 1     Standing Expiration Date:     Order Specific Question:  Diagnosis    Answer:  PID (acute pelvic inflammatory disease) [264010]    Order Specific Question:  Level of Care    Answer:  Med-Surg [16]    Order Specific Question:  Admitting Physician    Answer:  Antionette CharJACKSON-MOORE, Remmy Riffe [[id number]]    Order Specific Question:  Estimated length of stay    Answer:  5 - 7 days    Order Specific Question:  Certification:    Answer:  I certify this patient will need inpatient services for at least 2 midnights    Order Specific Question:  Attending Physician    Answer:  Antionette CharJACKSON-MOORE, Christoher Drudge [[id number]]    Order Specific Question:  PT Class (Do Not Modify)    Answer:  Inpatient [101]    Order Specific Question:  PT Acc Code (Do Not Modify)    Answer:  Private [1]   Meds ordered this encounter  Medications  . ibuprofen (ADVIL,MOTRIN) tablet 600 mg    Sig:   . simethicone (MYLICON) chewable tablet 80 mg    Sig:   . acetaminophen (TYLENOL) tablet 975 mg    Sig:   . oxyCODONE (Oxy IR/ROXICODONE) immediate release tablet 10 mg    Sig:   . clindamycin (CLEOCIN) 900 mg in dextrose 5 % 50 mL IVPB    Sig:     Order Specific Question:  Antibiotic Indication:    Answer:  Pelvic Inflammatory Disease     Follow up as  needed.         [name],Nera Haworth A 10/13/2013, 10:16 AM

## 2013-10-14 MED ORDER — ACETAMINOPHEN 500 MG PO TABS
1000.0000 mg | ORAL_TABLET | Freq: Four times a day (QID) | ORAL | Status: DC | PRN
  Administered 2013-10-15 – 2013-10-16 (×2): 1000 mg via ORAL
  Filled 2013-10-14 (×2): qty 2

## 2013-10-14 NOTE — Progress Notes (Signed)
Subjective: Patient reports tolerating PO, + flatus and no problems voiding. Patient reports pain is less intense but still present. Patient reports she thinks this was caused by tampon use.  Objective: I have reviewed patient's vital signs, intake and output, medications, labs, microbiology, pathology and radiology results.  General: alert and cooperative Resp: clear to auscultation bilaterally Cardio: regular rate and rhythm, S1, S2 normal, no murmur, click, rub or gallop GI: normal findings: bowel sounds normal and symmetric and abnormal findings:  guarding and generalized abdominal pain Extremities: extremities normal, atraumatic, no cyanosis or edema Vaginal Bleeding: minimal   Assessment/Plan: Will continue antibiotic treatment Reviewed + GC results w/ patient and probable cause of PID (patient gave permission to share information in front of partner). Pain medication PRN Encouraged ambulation. Will reevaluate mass after further antibiotic treatment. MD Clearance CootsHarper aware of patient status and agrees to plan of care.   LOS: 1 day    [facility], AMY 10/14/2013, 10:21 AM

## 2013-10-15 MED ORDER — IBUPROFEN 600 MG PO TABS
600.0000 mg | ORAL_TABLET | Freq: Four times a day (QID) | ORAL | Status: DC
Start: 2013-10-15 — End: 2013-10-17
  Administered 2013-10-15 – 2013-10-17 (×7): 600 mg via ORAL
  Filled 2013-10-15 (×6): qty 1

## 2013-10-15 MED ORDER — MAGNESIUM HYDROXIDE 400 MG/5ML PO SUSP
30.0000 mL | Freq: Every day | ORAL | Status: DC
  Administered 2013-10-15: 30 mL via ORAL
  Filled 2013-10-15: qty 30

## 2013-10-15 NOTE — Progress Notes (Signed)
Subjective: Patient reports increased pain this morning.  Has just received pain meds.  Objective: I have reviewed patient's vital signs, intake and output, medications, labs and microbiology.  General: alert and no distress GI: abnormal findings:  moderate tenderness diffusely Vaginal Bleeding: none   Assessment/Plan:Acute PID. Acute PID.  Stable.  Continue IV antibiotics.   LOS: 2 days    HARPER,CHARLES A 10/15/2013, 9:16 AM

## 2013-10-16 ENCOUNTER — Ambulatory Visit: Payer: Medicare Other | Admitting: Internal Medicine

## 2013-10-16 LAB — GENTAMICIN LEVEL, TROUGH: Gentamicin Trough: 1.3 ug/mL (ref 0.5–2.0)

## 2013-10-16 LAB — GENTAMICIN LEVEL, PEAK: Gentamicin Pk: 7.7 ug/mL (ref 5.0–10.0)

## 2013-10-16 MED ORDER — GENTAMICIN SULFATE 40 MG/ML IJ SOLN
Freq: Three times a day (TID) | INTRAVENOUS | Status: DC
  Administered 2013-10-16 – 2013-10-17 (×2): via INTRAVENOUS
  Filled 2013-10-16 (×3): qty 2.5

## 2013-10-16 NOTE — Progress Notes (Signed)
ANTIBIOTIC CONSULT NOTE - FOLLOW UP  Pharmacy Consult for Gentamicin Indication: PID  Allergies  Allergen Reactions  . Hydrocodone Itching    Ok to take with benadryl  . Morphine And Related Itching    Ok to take with benadryl  . Zyprexa [Olanzapine] Other (See Comments)    hallucinations    Patient Measurements: Height: 5\' 3"  (160 cm) Weight: 130 lb 4 oz (59.081 kg) IBW/kg (Calculated) : 52.4 kg Dosing Weight: 59 kg  Vital Signs: Temp: 98.2 F (36.8 C) (06/18 0641) Temp src: Oral (06/18 0641) BP: 105/72 mmHg (06/18 0641) Pulse Rate: 54 (06/18 0641) Intake/Output from previous day: 06/17 0701 - 06/18 0700 In: 1984.4 [P.O.:1060; I.V.:924.4] Out: 1900 [Urine:1900] Intake/Output from this shift: Total I/O In: 488.8 [P.O.:360; I.V.:128.8] Out: 150 [Urine:150]  Labs:  Recent Labs  10/13/13 1236  WBC 9.5  HGB 9.5*  PLT 273  CREATININE 0.68   Estimated Creatinine Clearance: 82 ml/min (by C-G formula based on Cr of 0.68).  Recent Labs  10/16/13 0540 10/16/13 0702  GENTTROUGH 1.3  --   GENTPEAK  --  7.7     Microbiology: Recent Results (from the past 720 hour(s))  URINE CULTURE     Status: None   Collection Time    09/23/13  1:18 PM      Result Value Ref Range Status   Specimen Description URINE, CLEAN CATCH   Final   Special Requests NONE   Final   Culture  Setup Time     Final   Value: 09/23/2013 21:27     Performed at [facility]   Colony Count     Final   Value: >=100,000 COLONIES/ML     Performed at [facility]   Culture     Final   Value: Multiple bacterial morphotypes present, none predominant. Suggest appropriate recollection if clinically indicated.     Performed at [facility]   Report Status 09/24/2013 FINAL   Final  WET PREP, GENITAL     Status: Abnormal   Collection Time    10/09/13  7:40 AM      Result Value Ref Range Status   Yeast Wet Prep HPF POC NONE SEEN  NONE SEEN Final   Trich, Wet Prep NONE SEEN   NONE SEEN Final   Clue Cells Wet Prep HPF POC NONE SEEN  NONE SEEN Final   WBC, Wet Prep HPF POC FEW (*) NONE SEEN Final  GC/CHLAMYDIA PROBE AMP     Status: Abnormal   Collection Time    10/09/13  7:40 AM      Result Value Ref Range Status   CT Probe RNA NEGATIVE  NEGATIVE Final   GC Probe RNA POSITIVE (*) NEGATIVE Final   Comment: (NOTE)     A Positive CT or NG Nucleic Acid Amplification Test (NAAT) result     should be considered presumptive evidence of infection.  The result     should be evaluated along with physical examination and other     diagnostic findings.                                                                                               **  Normal Reference Range: Negative**          Assay performed using the Gen-Probe APTIMA COMBO2 (R) Assay.     Acceptable specimen types for this assay include APTIMA Swabs (Unisex,     endocervical, urethral, or vaginal), first void urine, and ThinPrep     liquid based cytology samples.     Performed at [facility]    Anti-infectives   Start     Dose/Rate Route Frequency Ordered Stop   10/13/13 1400  clindamycin (CLEOCIN) IVPB 900 mg  Status:  Discontinued     900 mg 100 mL/hr over 30 Minutes Intravenous 3 times per day 10/13/13 1155 10/13/13 1311   10/13/13 1400  gentamicin (GARAMYCIN) 7,090 mg, clindamycin (CLEOCIN) 900 mg in dextrose 5 % 100 mL IVPB  Status:  Discontinued     566.5 mL/hr over 30 Minutes Intravenous 3 times per day 10/13/13 1312 10/13/13 1315   10/13/13 1400  gentamicin (GARAMYCIN) 120 mg, clindamycin (CLEOCIN) 900 mg in dextrose 5 % 100 mL IVPB     218 mL/hr over 30 Minutes Intravenous 3 times per day 10/13/13 1315        Assessment: Pt is afebrile and less tender today.  She is on day 4 of IV Gentamicin and Clindamycin. Based on the gentamicin levels drawn this morning, the extrapolated gentamicin peak = 9.2 mcg/ml and the extrapolated trough = 1.2 mcg/ml.  The patient's measured gentamicin  half life = 2.6 hours, and the measured Vd = 0.26 L/kg.  These findings suggest that the gentamicin peak is slightly greater than the desired goal of 6-8.  Goal of Therapy:  Gentamicin peak 6-8 and trough <1  Plan:  Will reduce gentamicin dose to 100 mg IV Q 8 hr to maintain target peak levels.  Natasha BenceCline, Julie 10/16/2013,10:07 AM

## 2013-10-16 NOTE — Progress Notes (Signed)
Subjective: Patient reports less pain.  Objective: I have reviewed patient's vital signs, intake and output, medications, labs and microbiology.  General: alert and no distress GI: abnormal findings:  moderate tenderness diffuse, but less than yesterday Vaginal Bleeding: none   Assessment/Plan: PID.  Stable.  Continue IV antibiotics.  LOS: 3 days    HARPER,CHARLES A 10/16/2013, 6:10 AM

## 2013-10-17 MED ORDER — IBUPROFEN 800 MG PO TABS: 800.0000 mg | ORAL_TABLET | Freq: Three times a day (TID) | ORAL | Status: DC | PRN

## 2013-10-17 MED ORDER — METRONIDAZOLE 500 MG PO TABS: 500.0000 mg | ORAL_TABLET | Freq: Three times a day (TID) | ORAL | Status: DC

## 2013-10-17 MED ORDER — DOXYCYCLINE HYCLATE 100 MG PO CAPS: 100.0000 mg | ORAL_CAPSULE | Freq: Two times a day (BID) | ORAL | Status: DC

## 2013-10-17 MED ORDER — OXYCODONE HCL 10 MG PO TABS: 10.0000 mg | ORAL_TABLET | Freq: Four times a day (QID) | ORAL | Status: DC | PRN

## 2013-10-17 NOTE — Discharge Summary (Signed)
Physician Discharge Summary  Patient ID: Angela HammockLamia S Hollyfield MRN: [id number] DOB/AGE: 34/11/1980 34 y.o.  Admit date: 10/13/2013 Discharge date: 10/17/2013  Admission Diagnoses:  PID  Discharge Diagnoses: Same Active Problems:   PID (acute pelvic inflammatory disease)   Discharged Condition: good  Hospital Course: Admitted with PID.  Responded well to IV antibiotics.  Discharged home in good condition.   Consults: None  Significant Diagnostic Studies: radiology: Ultrasound: Suspicious for TOA.  Treatments: IV hydration, antibiotics: gentamycin and clindamycin and analgesia: percocet  Discharge Exam: Blood pressure 103/49, pulse 52, temperature 98.1 F (36.7 C), temperature source Oral, resp. rate 18, height 5\' 3"  (1.6 m), weight 130 lb 4 oz (59.081 kg), last menstrual period 10/03/2013, SpO2 100.00%. General appearance: alert and no distress GI: normal findings: bowel sounds normal and soft, non-tender  Disposition: 01-Home or Self Care  Discharge Instructions   Call MD for:  difficulty breathing, headache or visual disturbances    Complete by:  As directed      Call MD for:  extreme fatigue    Complete by:  As directed      Call MD for:  hives    Complete by:  As directed      Call MD for:  persistant dizziness or light-headedness    Complete by:  As directed      Call MD for:  persistant nausea and vomiting    Complete by:  As directed      Call MD for:  redness, tenderness, or signs of infection (pain, swelling, redness, odor or green/yellow discharge around incision site)    Complete by:  As directed      Call MD for:  severe uncontrolled pain    Complete by:  As directed      Call MD for:  temperature >100.4    Complete by:  As directed      Call MD for:    Complete by:  As directed      Diet - low sodium heart healthy    Complete by:  As directed      Increase activity slowly    Complete by:  As directed             Medication List    STOP taking these  medications       oxyCODONE-acetaminophen 5-325 MG per tablet  Commonly known as:  PERCOCET/ROXICET      TAKE these medications       doxycycline 100 MG capsule  Commonly known as:  VIBRAMYCIN  Take 1 capsule (100 mg total) by mouth 2 (two) times daily.     ibuprofen 800 MG tablet  Commonly known as:  ADVIL,MOTRIN  Take 1 tablet (800 mg total) by mouth every 8 (eight) hours as needed.     metroNIDAZOLE 500 MG tablet  Commonly known as:  FLAGYL  Take 1 tablet (500 mg total) by mouth 3 (three) times daily.     Oxycodone HCl 10 MG Tabs  Take 1 tablet (10 mg total) by mouth every 6 (six) hours as needed for breakthrough pain.           Follow-up Information   Follow up with HARPER,CHARLES A, MD. Schedule an appointment as soon as possible for a visit in 2 weeks.   Specialty:  Obstetrics and Gynecology   Contact information:   [address] [place] [address] [phone]       Signed: Brock BadHARPER,CHARLES A 10/17/2013, 6:14 AM

## 2013-10-17 NOTE — Progress Notes (Signed)
Pt discharged home with significant other... Discharged instructions reviewed with pt and she verbalized understanding... Condition stable... No equipment... Ambulated to car with Selinda MichaelsE. Caldwell, RN.

## 2013-10-17 NOTE — Discharge Instructions (Signed)
Pelvic Inflammatory Disease °Pelvic inflammatory disease (PID) refers to an infection in some or all of the female organs. The infection can be in the uterus, ovaries, fallopian tubes, or the surrounding tissues in the pelvis. PID can cause abdominal or pelvic pain that comes on suddenly (acute pelvic pain). PID is a serious infection because it can lead to lasting (chronic) pelvic pain or the inability to have children (infertile).  °CAUSES  °The infection is often caused by the normal bacteria found in the vaginal tissues. PID may also be caused by an infection that is spread during sexual contact. PID can also occur following:  °· The birth of a baby.   °· A miscarriage.   °· An abortion.   °· Major pelvic surgery.   °· The use of an intrauterine device (IUD).   °· A sexual assault.   °RISK FACTORS °Certain factors can put a person at higher risk for PID, such as: °· Being younger than 25 years. °· Being sexually active at a young age. °· Using nonbarrier contraception. °· Having multiple sexual partners. °· Having sex with someone who has symptoms of a genital infection. °· Using oral contraception. °Other times, certain behaviors can increase the possibility of getting PID, such as: °· Having sex during your period. °· Using a vaginal douche. °· Having an intrauterine device (IUD) in place. °SYMPTOMS  °· Abdominal or pelvic pain.   °· Fever.   °· Chills.   °· Abnormal vaginal discharge. °· Abnormal uterine bleeding.   °· Unusual pain shortly after finishing your period. °DIAGNOSIS  °Your caregiver will choose some of the following methods to make a diagnosis, such as:  °· Performing a physical exam and history. A pelvic exam typically reveals a very tender uterus and surrounding pelvis.   °· Ordering laboratory tests including a pregnancy test, blood tests, and urine test.  °· Ordering cultures of the vagina and cervix to check for a sexually transmitted infection (STI). °· Performing an ultrasound.    °· Performing a laparoscopic procedure to look inside the pelvis.   °TREATMENT  °· Antibiotic medicines may be prescribed and taken by mouth.   °· Sexual partners may be treated when the infection is caused by a sexually transmitted disease (STD).   °· Hospitalization may be needed to give antibiotics intravenously. °· Surgery may be needed, but this is rare. °It may take weeks until you are completely well. If you are diagnosed with PID, you should also be checked for human immunodeficiency virus (HIV).   °HOME CARE INSTRUCTIONS  °· If given, take your antibiotics as directed. Finish the medicine even if you start to feel better.   °· Only take over-the-counter or prescription medicines for pain, discomfort, or fever as directed by your caregiver.   °· Do not have sexual intercourse until treatment is completed or as directed by your caregiver. If PID is confirmed, your recent sexual partner(s) will need treatment.   °· Keep your follow-up appointments. °SEEK MEDICAL CARE IF:  °· You have increased or abnormal vaginal discharge.   °· You need prescription medicine for your pain.   °· You vomit.   °· You cannot take your medicines.   °· Your partner has an STD.   °SEEK IMMEDIATE MEDICAL CARE IF:  °· You have a fever.   °· You have increased abdominal or pelvic pain.   °· You have chills.   °· You have pain when you urinate.   °· You are not better after 72 hours following treatment.   °MAKE SURE YOU:  °· Understand these instructions. °· Will watch your condition. °· Will get help right away if you are not doing well or get worse. °  Document Released: 04/17/2005 Document Revised: 08/12/2012 Document Reviewed: 04/13/2011 °Exit[employer]® Patient Information ©2015 [employer]. This information is not intended to replace advice given to you by your health care provider. Make sure you discuss any questions you have with your health care provider. ° °

## 2013-10-17 NOTE — Progress Notes (Signed)
Subjective: Patient reports no pain.  Objective: I have reviewed patient's vital signs, intake and output, medications, labs and microbiology.  Ultrasound results also reviewed.  General: alert and no distress GI: normal findings: bowel sounds normal and soft, non-tender Vaginal Bleeding: none   Assessment/Plan: Acute PID.  Much improved.  Discharge home on po Doxy / Flagyl x 2 weeks.  F/U in office in 2 weeks.  LOS: 4 days    HARPER,CHARLES A 10/17/2013, 6:01 AM

## 2013-10-22 ENCOUNTER — Telehealth (HOSPITAL_BASED_OUTPATIENT_CLINIC_OR_DEPARTMENT_OTHER): Payer: Self-pay

## 2013-10-22 NOTE — Telephone Encounter (Signed)
Brandi w[facility] calling to see if pt rcvd tx for STD. Informed rcvd Rx for Doxycycline 100 mg BID x 14 days. On 6/19

## 2013-11-05 ENCOUNTER — Telehealth (HOSPITAL_BASED_OUTPATIENT_CLINIC_OR_DEPARTMENT_OTHER): Payer: Self-pay

## 2013-11-05 ENCOUNTER — Ambulatory Visit: Payer: Medicare Other | Admitting: Obstetrics

## 2013-11-05 NOTE — Telephone Encounter (Signed)
Brandi calling from [facility] needing clarification of tx for STD.

## 2013-11-11 ENCOUNTER — Ambulatory Visit: Payer: Medicare Other | Admitting: Obstetrics

## 2013-12-10 ENCOUNTER — Ambulatory Visit (INDEPENDENT_AMBULATORY_CARE_PROVIDER_SITE_OTHER): Payer: Medicare Other | Admitting: Obstetrics

## 2013-12-10 ENCOUNTER — Other Ambulatory Visit: Payer: Self-pay | Admitting: Obstetrics

## 2013-12-10 ENCOUNTER — Encounter: Payer: Self-pay | Admitting: Obstetrics

## 2013-12-10 VITALS — BP 126/92 | HR 55 | Temp 98.5°F | Ht 63.0 in | Wt 133.0 lb

## 2013-12-10 DIAGNOSIS — N912 Amenorrhea, unspecified: Secondary | ICD-10-CM

## 2013-12-10 DIAGNOSIS — N946 Dysmenorrhea, unspecified: Secondary | ICD-10-CM

## 2013-12-10 DIAGNOSIS — N926 Irregular menstruation, unspecified: Secondary | ICD-10-CM | POA: Insufficient documentation

## 2013-12-10 DIAGNOSIS — N739 Female pelvic inflammatory disease, unspecified: Secondary | ICD-10-CM | POA: Insufficient documentation

## 2013-12-10 MED ORDER — MEDROXYPROGESTERONE ACETATE 10 MG PO TABS: 20.0000 mg | ORAL_TABLET | Freq: Every day | ORAL | Status: DC

## 2013-12-10 MED ORDER — DOXYCYCLINE HYCLATE 100 MG PO CAPS: 100.0000 mg | ORAL_CAPSULE | Freq: Two times a day (BID) | ORAL | Status: DC

## 2013-12-10 MED ORDER — METRONIDAZOLE 500 MG PO TABS: 500.0000 mg | ORAL_TABLET | Freq: Two times a day (BID) | ORAL | Status: DC

## 2013-12-10 MED ORDER — OXYCODONE HCL 10 MG PO TABS: 10.0000 mg | ORAL_TABLET | Freq: Four times a day (QID) | ORAL | Status: DC | PRN

## 2013-12-10 NOTE — Progress Notes (Signed)
Patient ID: Angela Andrade, female   DOB: 06-21-1980, 33 y.o.   MRN: [id number]  Chief Complaint  Patient presents with  . Follow-up    HPI Angela Andrade is a 33 y.o. female.  C/O continued pelvic pain and no period since June 2015.  H/O PID, with admission to [facility] for IV antibiotic treatment in June 2015.  Discharged home on 2 weeks of Doxy / Flagyl.  HPI  Past Medical History  Diagnosis Date  . Anemia   . Anxiety   . Depression   . Arthritis     Past Surgical History  Procedure Laterality Date  . Pelvic fracture surgery    . Hip fracture surgery    . Coma    . Spleen repair    . Brain injury      Family History  Problem Relation Age of Onset  . Cancer Mother   . Hypertension Mother   . Diabetes Maternal Aunt   . Hypertension Maternal Aunt   . Diabetes Maternal Uncle   . Hypertension Maternal Uncle   . Cancer Maternal Grandmother   . Diabetes Maternal Grandmother   . Hypertension Maternal Grandmother     Social History History  Substance Use Topics  . Smoking status: Former Smoker -- 0.25 packs/day    Types: Cigarettes    Quit date: 07/06/2011  . Smokeless tobacco: Not on file  . Alcohol Use: No    Allergies  Allergen Reactions  . Hydrocodone Itching    Ok to take with benadryl  . Morphine And Related Itching    Ok to take with benadryl  . Zyprexa [Olanzapine] Other (See Comments)    hallucinations    Current Outpatient Prescriptions  Medication Sig Dispense Refill  . ibuprofen (ADVIL,MOTRIN) 800 MG tablet Take 1 tablet (800 mg total) by mouth every 8 (eight) hours as needed.  30 tablet  5  . doxycycline (VIBRAMYCIN) 100 MG capsule Take 1 capsule (100 mg total) by mouth 2 (two) times daily.  28 capsule  1  . medroxyPROGESTERone (PROVERA) 10 MG tablet Take 2 tablets (20 mg total) by mouth daily.  20 tablet  0  . metroNIDAZOLE (FLAGYL) 500 MG tablet Take 1 tablet (500 mg total) by mouth 2 (two) times daily.  28 tablet  1  . Oxycodone HCl 10 MG TABS Take 1  tablet (10 mg total) by mouth every 6 (six) hours as needed.  40 tablet  0   No current facility-administered medications for this visit.    Review of Systems Review of Systems Constitutional: negative for fatigue and weight loss Respiratory: negative for cough and wheezing Cardiovascular: negative for chest pain, fatigue and palpitations Gastrointestinal: negative for abdominal pain and change in bowel habits Genitourinary:  Positive for pelvic pain and irregular cycles Integument/breast: negative for nipple discharge Musculoskeletal:negative for myalgias Neurological: negative for gait problems and tremors Behavioral/Psych: negative for abusive relationship, depression Endocrine: negative for temperature intolerance     Blood pressure 126/92, pulse 55, temperature 98.5 F (36.9 C), height 5\' 3"  (1.6 m), weight 133 lb (60.328 kg), last menstrual period 10/30/2013, not currently breastfeeding.  Physical Exam Physical Exam General:   alert  Skin:   no rash or abnormalities  Lungs:   clear to auscultation bilaterally  Heart:   regular rate and rhythm, S1, S2 normal, no murmur, click, rub or gallop  Breasts:   normal without suspicious masses, skin or nipple changes or axillary nodes  Abdomen:  normal findings: no organomegaly,  soft, non-tender and no hernia  Pelvis:  External genitalia: normal general appearance Urinary system: urethral meatus normal and bladder without fullness, nontender Vaginal: normal without tenderness, induration or masses Cervix: normal appearance Adnexa: bimanual exam significant for adnexal tenderness, R>L. Uterus: anteverted and non-tender, normal size      Data Reviewed Labs Ultrasound  Assessment    PID, chronic.  Irregular cycles.  No period since June.    Plan    Doxy / Flagyl Rx for 2 weeks. Provera Rx F/U in 6 weeks.  Orders Placed This Encounter  Procedures  . WET PREP BY MOLECULAR PROBE  . GC/Chlamydia Probe Amp  . POCT urine  pregnancy   Meds ordered this encounter  Medications  . medroxyPROGESTERone (PROVERA) 10 MG tablet    Sig: Take 2 tablets (20 mg total) by mouth daily.    Dispense:  20 tablet    Refill:  0  . Oxycodone HCl 10 MG TABS    Sig: Take 1 tablet (10 mg total) by mouth every 6 (six) hours as needed.    Dispense:  40 tablet    Refill:  0  . doxycycline (VIBRAMYCIN) 100 MG capsule    Sig: Take 1 capsule (100 mg total) by mouth 2 (two) times daily.    Dispense:  28 capsule    Refill:  1  . metroNIDAZOLE (FLAGYL) 500 MG tablet    Sig: Take 1 tablet (500 mg total) by mouth 2 (two) times daily.    Dispense:  28 tablet    Refill:  1        HARPER,CHARLES A 12/10/2013, 2:42 PM

## 2013-12-11 LAB — GC/CHLAMYDIA PROBE AMP
CT PROBE, AMP APTIMA: NEGATIVE
GC Probe RNA: NEGATIVE

## 2013-12-11 LAB — WET PREP BY MOLECULAR PROBE
Candida species: NEGATIVE
Gardnerella vaginalis: NEGATIVE
Trichomonas vaginosis: NEGATIVE

## 2014-01-21 ENCOUNTER — Encounter: Payer: Self-pay | Admitting: Obstetrics

## 2014-01-21 ENCOUNTER — Ambulatory Visit (INDEPENDENT_AMBULATORY_CARE_PROVIDER_SITE_OTHER): Payer: Medicare Other

## 2014-01-21 ENCOUNTER — Ambulatory Visit (INDEPENDENT_AMBULATORY_CARE_PROVIDER_SITE_OTHER): Payer: Medicare Other | Admitting: Obstetrics

## 2014-01-21 VITALS — BP 124/78 | HR 66 | Temp 97.6°F | Ht 63.0 in | Wt 134.0 lb

## 2014-01-21 DIAGNOSIS — N730 Acute parametritis and pelvic cellulitis: Secondary | ICD-10-CM

## 2014-01-21 DIAGNOSIS — N7013 Chronic salpingitis and oophoritis: Secondary | ICD-10-CM

## 2014-01-21 DIAGNOSIS — R1904 Left lower quadrant abdominal swelling, mass and lump: Secondary | ICD-10-CM

## 2014-01-21 DIAGNOSIS — N946 Dysmenorrhea, unspecified: Secondary | ICD-10-CM

## 2014-01-21 DIAGNOSIS — N7011 Chronic salpingitis: Secondary | ICD-10-CM | POA: Insufficient documentation

## 2014-01-21 DIAGNOSIS — N739 Female pelvic inflammatory disease, unspecified: Secondary | ICD-10-CM

## 2014-01-21 MED ORDER — OXYCODONE HCL 10 MG PO TABS: 10.0000 mg | ORAL_TABLET | Freq: Four times a day (QID) | ORAL | Status: DC | PRN

## 2014-01-22 NOTE — Progress Notes (Signed)
Patient ID: Angela Andrade, female   DOB: [birth year], 33 y.o.   MRN: [id number]  Chief Complaint  Patient presents with  . Follow-up    HPI Angela Andrade is a 33 y.o. female.  Presents with LLQ pain.  Previously admitted from the office by Dr. Tamela Oddi in June for PID.  She received inpatient treatment for PID with IV antibiotics.  Ultrasound showed a mass adjacent to the left ovary c/w a hydrosalpinx. HPI  Past Medical History  Diagnosis Date  . Anemia   . Anxiety   . Depression   . Arthritis     Past Surgical History  Procedure Laterality Date  . Pelvic fracture surgery    . Hip fracture surgery    . Coma    . Spleen repair    . Brain injury      Family History  Problem Relation Age of Onset  . Cancer Mother   . Hypertension Mother   . Diabetes Maternal Aunt   . Hypertension Maternal Aunt   . Diabetes Maternal Uncle   . Hypertension Maternal Uncle   . Cancer Maternal Grandmother   . Diabetes Maternal Grandmother   . Hypertension Maternal Grandmother     Social History History  Substance Use Topics  . Smoking status: Former Smoker -- 0.25 packs/day    Types: Cigarettes    Quit date: 07/06/2011  . Smokeless tobacco: Not on file  . Alcohol Use: No    Allergies  Allergen Reactions  . Hydrocodone Itching    Ok to take with benadryl  . Morphine And Related Itching    Ok to take with benadryl  . Zyprexa [Olanzapine] Other (See Comments)    hallucinations    Current Outpatient Prescriptions  Medication Sig Dispense Refill  . ibuprofen (ADVIL,MOTRIN) 800 MG tablet Take 1 tablet (800 mg total) by mouth every 8 (eight) hours as needed.  30 tablet  5  . Oxycodone HCl 10 MG TABS Take 1 tablet (10 mg total) by mouth every 6 (six) hours as needed.  40 tablet  0  . medroxyPROGESTERone (PROVERA) 10 MG tablet Take 2 tablets (20 mg total) by mouth daily.  20 tablet  0   No current facility-administered medications for this visit.    Review of Systems Review of  Systems Constitutional: negative for fatigue and weight loss Respiratory: negative for cough and wheezing Cardiovascular: negative for chest pain, fatigue and palpitations Gastrointestinal: negative for abdominal pain and change in bowel habits Genitourinary:H/O PID Integument/breast: negative for nipple discharge Musculoskeletal:negative for myalgias Neurological: negative for gait problems and tremors Behavioral/Psych: negative for abusive relationship, depression Endocrine: negative for temperature intolerance     Blood pressure 124/78, pulse 66, temperature 97.6 F (36.4 C), height  (1.6 m), weight 134 lb (60.782 kg), last menstrual period 01/07/2014, not currently breastfeeding.  Physical Exam Physical Exam General:   alert  Skin:   no rash or abnormalities  Lungs:   clear to auscultation bilaterally  Heart:   regular rate and rhythm, S1, S2 normal, no murmur, click, rub or gallop  Breasts:   normal without suspicious masses, skin or nipple changes or axillary nodes  Abdomen:  normal findings: no organomegaly, soft, non-tender and no hernia  Pelvis:  External genitalia: normal general appearance Urinary system: urethral meatus normal and bladder without fullness, nontender Vaginal: normal without tenderness, induration or masses Cervix: normal appearance Adnexa: LLQ tenderness to palpation. Uterus: anteverted and non-tender, normal size     Data  Reviewed Ultrasound  Assessment    Hydrosalpinx, left.  S/P PID.  Ultrasound today shows a left hydrosalpinx.    Plan   F/U with Dr. Tamela Oddi for possible Robotic assisted left salpingectomy.   Orders Placed This Encounter  Procedures  . US Transvaginal Non-OB    Standing Status: Future     Number of Occurrences: 1     Standing Expiration Date: 03/24/2015    Order Specific Question:  Reason for Exam (SYMPTOM  OR DIAGNOSIS REQUIRED)    Answer:  LLQ mass.    Order Specific Question:  Preferred imaging location?     Answer:  Internal   Meds ordered this encounter  Medications  . Oxycodone HCl 10 MG TABS    Sig: Take 1 tablet (10 mg total) by mouth every 6 (six) hours as needed.    Dispense:  40 tablet    Refill:  0        Nashon Erbes A 01/22/2014, 4:53 AM

## 2014-01-29 ENCOUNTER — Encounter: Payer: Self-pay | Admitting: Obstetrics & Gynecology

## 2014-01-29 ENCOUNTER — Ambulatory Visit (INDEPENDENT_AMBULATORY_CARE_PROVIDER_SITE_OTHER): Payer: Medicare Other | Admitting: Obstetrics & Gynecology

## 2014-01-29 DIAGNOSIS — M6289 Other specified disorders of muscle: Secondary | ICD-10-CM

## 2014-01-29 DIAGNOSIS — N8184 Pelvic muscle wasting: Secondary | ICD-10-CM

## 2014-01-30 NOTE — Progress Notes (Signed)
Patient ID: Angela Andrade, female   DOB: 10/20/1980, 33 y.o.   MRN: [id number]  Chief Complaint  Patient presents with  . Follow-up    ultrasound    HPI Angela Andrade is a 33 y.o. female.  A recent pelvic U/S showed a unilateral hydrosalpinx.  HPI  Past Medical History  Diagnosis Date  . Anemia   . Anxiety   . Depression   . Arthritis     Past Surgical History  Procedure Laterality Date  . Pelvic fracture surgery    . Hip fracture surgery    . Coma    . Spleen repair    . Brain injury      Family History  Problem Relation Age of Onset  . Cancer Mother   . Hypertension Mother   . Diabetes Maternal Aunt   . Hypertension Maternal Aunt   . Diabetes Maternal Uncle   . Hypertension Maternal Uncle   . Cancer Maternal Grandmother   . Diabetes Maternal Grandmother   . Hypertension Maternal Grandmother     Social History History  Substance Use Topics  . Smoking status: Former Smoker -- 0.25 packs/day    Types: Cigarettes    Quit date: 07/06/2011  . Smokeless tobacco: Not on file  . Alcohol Use: No    Allergies  Allergen Reactions  . Hydrocodone Itching    Ok to take with benadryl  . Morphine And Related Itching    Ok to take with benadryl  . Zyprexa [Olanzapine] Other (See Comments)    hallucinations    Current Outpatient Prescriptions  Medication Sig Dispense Refill  . ibuprofen (ADVIL,MOTRIN) 800 MG tablet Take 1 tablet (800 mg total) by mouth every 8 (eight) hours as needed.  30 tablet  5  . medroxyPROGESTERone (PROVERA) 10 MG tablet Take 2 tablets (20 mg total) by mouth daily.  20 tablet  0  . Oxycodone HCl 10 MG TABS Take 1 tablet (10 mg total) by mouth every 6 (six) hours as needed.  40 tablet  0   No current facility-administered medications for this visit.    Review of Systems Review of Systems Constitutional: negative for fatigue and weight loss Respiratory: negative for cough and wheezing Cardiovascular: negative for chest pain, fatigue and  palpitations Gastrointestinal: negative for abdominal pain and change in bowel habits Genitourinary: positive for pelvic pain Integument/breast: negative for nipple discharge Musculoskeletal:negative for myalgias Neurological: negative for gait problems and tremors Behavioral/Psych: negative for abusive relationship, depression Endocrine: negative for temperature intolerance     Last menstrual period 01/07/2014, not currently breastfeeding.  Physical Exam Physical Exam   50% of 15 min visit spent on counseling and coordination of care.   Data Reviewed Pelvic U/S  Assessment    Chronic pelvic pain syndrome Hydrosalpinx ?Myofascial pain/pelvic floor dysfunction     Plan    Orders Placed This Encounter  Procedures  . Ambulatory referral to Physical Therapy    Referral Priority:  Routine    Referral Type:  Physical Medicine    Referral Reason:  Specialty Services Required    Requested Specialty:  Physical Therapy    Number of Visits Requested:  1    Follow up as needed.         [name],Elizardo Chilson A 01/30/2014, 1:13 PM

## 2014-01-30 NOTE — Patient Instructions (Signed)
Myofascial Pain Syndrome °Myofascial pain syndrome is a pain disorder. This pain may be felt in the muscles. It may come and go. Myofascial pain syndrome always has trigger or tender points in the muscle that will cause pain when pressed.  °CAUSES °Myofascial pain may be caused by injuries, especially auto accidents, or by overuse of certain muscles. Typically the pain is long lasting. It is made worse by overuse of the involved muscles, emotional distress, and by cold, damp weather. Myofascial pain syndrome often develops in patients whose response to stress is an increase in muscle tone, and is seen in greater frequency in patients with pre-existing tension headaches. °SYMPTOMS  °Myofascial pain syndrome causes a wide variety of symptoms. You may see tight ropy bands of muscle. Problems may also include aching, cramping, burning, numbness, tingling, and other uncomfortable sensations in muscular areas. It most commonly affects the neck, upper back, and shoulder areas. Pain often radiates into the arms and hands.  °TREATMENT °Treatment includes resting the affected muscular area and applying ice packs to reduce spasm and pain. Trigger point injection, is a valuable initial therapy. This therapy is an injection of local anesthetic directly into the trigger point. Trigger points are often present at the source of pain. Pain relief following injection confirms the diagnosis of myofascial pain syndrome. Fairly vigorous therapy can be carried out during the pain-free period after each injection. Stretching exercises to loosen up the muscles are also useful. Transcutaneous electrical nerve stimulation (TENS) may provide relief from pain. TENS is the use of electric current produced by a device to stimulate the nerves. Ultrasound therapy applied directly over the affected muscle may also provide pain relief. Anti-inflammatory pain medicine can be helpful. Symptoms will gradually improve over a period of weeks to months  with proper treatment. °HOME CARE INSTRUCTIONS °Call your caregiver for follow-up care as recommended.  °SEEK MEDICAL CARE IF:  °Your pain is severe and not helped with medications. °Document Released: 05/25/2004 Document Revised: 07/10/2011 Document Reviewed: 06/03/2010 °ExitCare® Patient Information ©2015 [employer]. This information is not intended to replace advice given to you by your health care provider. Make sure you discuss any questions you have with your health care provider. ° °

## 2014-02-02 ENCOUNTER — Ambulatory Visit: Payer: Medicare Other | Attending: Physical Therapy | Admitting: Physical Therapy

## 2014-02-10 ENCOUNTER — Telehealth: Payer: Self-pay

## 2014-02-10 NOTE — Telephone Encounter (Signed)
Pt had a appt for therapy on 02/02/14, she no-showed. I called and left a message for her - SHE NO SHOWED-NEVER CALLED BACK.

## 2014-02-20 ENCOUNTER — Telehealth: Payer: Self-pay | Admitting: *Deleted

## 2014-02-20 NOTE — Telephone Encounter (Signed)
Patient states she is out of pain medication. Patient states she is still having pain. Patient is requesting a refill.

## 2014-02-23 NOTE — Telephone Encounter (Signed)
Patient called the office 02-23-14 @ 9:57 am.  Per Dr. Clearance CootsHarper ok to fill Ibuprofen 800 mg 1 tablet by mouth every 8 hours #40 with 5 refills.  Attempted to contact patient and left message for patient to contact the office.

## 2014-02-24 NOTE — Telephone Encounter (Signed)
Pt called to request med refill Oxycodone 10mg   I spoke with Dr. Clearance CootsHarper and he denied to refill requested,  pt has  Ibuprofen 800 mg (refilled) . Pt stated she wants to sch a surgery.  [place]

## 2014-02-25 ENCOUNTER — Ambulatory Visit: Payer: Medicare Other | Admitting: Physical Therapy

## 2014-02-25 NOTE — Telephone Encounter (Signed)
Patient returned call. 7:11 Attempted to call patient- LM on VM to CB next day

## 2014-02-27 NOTE — Telephone Encounter (Signed)
10.30.2015 - Called patient to schedule her Pre-Op appt with Dr. Tamela OddiJackson-Moore. Left PAtient a VM to please call back and schedule the appt. brm

## 2014-02-27 NOTE — Telephone Encounter (Signed)
Call to patient she is upset that the provider is not helping her with her pain- but she does state the Doxycycline is helping her bowel symptoms. Patient states she has seen both providers about removal of her tube and she wants to schedule surgery.

## 2014-02-27 NOTE — Telephone Encounter (Signed)
Per Dr Clearance CootsHarper- patient needs pre op with Tamela OddiJackson Moore.

## 2014-03-02 ENCOUNTER — Encounter: Payer: Self-pay | Admitting: Obstetrics & Gynecology

## 2014-03-03 ENCOUNTER — Encounter (HOSPITAL_COMMUNITY): Payer: Self-pay | Admitting: Emergency Medicine

## 2014-03-03 ENCOUNTER — Ambulatory Visit (HOSPITAL_COMMUNITY)
Admission: RE | Admit: 2014-03-03 | Discharge: 2014-03-03 | Disposition: A | Discharge status: Home / Self Care | Payer: Medicare Other | Attending: Psychiatry | Admitting: Psychiatry

## 2014-03-03 ENCOUNTER — Emergency Department (EMERGENCY_DEPARTMENT_HOSPITAL)
Admission: EM | Admit: 2014-03-03 | Discharge: 2014-03-04 | Disposition: A | Discharge status: Other Acute Inpatient Hospital | Payer: Medicare Other | Source: Home / Self Care | Attending: Emergency Medicine | Admitting: Emergency Medicine

## 2014-03-03 ENCOUNTER — Emergency Department (HOSPITAL_COMMUNITY): Payer: Medicare Other

## 2014-03-03 DIAGNOSIS — Z87891 Personal history of nicotine dependence: Secondary | ICD-10-CM

## 2014-03-03 DIAGNOSIS — F39 Unspecified mood [affective] disorder: Secondary | ICD-10-CM | POA: Diagnosis present

## 2014-03-03 DIAGNOSIS — R519 Headache, unspecified: Secondary | ICD-10-CM

## 2014-03-03 DIAGNOSIS — F329 Major depressive disorder, single episode, unspecified: Secondary | ICD-10-CM | POA: Diagnosis not present

## 2014-03-03 DIAGNOSIS — F3162 Bipolar disorder, current episode mixed, moderate: Secondary | ICD-10-CM | POA: Diagnosis not present

## 2014-03-03 DIAGNOSIS — R51 Headache: Secondary | ICD-10-CM

## 2014-03-03 LAB — CBC
HCT: 36.2 % (ref 36.0–46.0)
HEMOGLOBIN: 11.8 g/dL — AB (ref 12.0–15.0)
MCH: 24.5 pg — ABNORMAL LOW (ref 26.0–34.0)
MCHC: 32.6 g/dL (ref 30.0–36.0)
MCV: 75.3 fL — ABNORMAL LOW (ref 78.0–100.0)
PLATELETS: 298 10*3/uL (ref 150–400)
RBC: 4.81 MIL/uL (ref 3.87–5.11)
RDW: 14.5 % (ref 11.5–15.5)
WBC: 7.6 10*3/uL (ref 4.0–10.5)

## 2014-03-03 LAB — COMPREHENSIVE METABOLIC PANEL
ALK PHOS: 49 U/L (ref 39–117)
ALT: 8 U/L (ref 0–35)
AST: 16 U/L (ref 0–37)
Albumin: 4.8 g/dL (ref 3.5–5.2)
Anion gap: 15 (ref 5–15)
BUN: 8 mg/dL (ref 6–23)
CHLORIDE: 103 meq/L (ref 96–112)
CO2: 23 mEq/L (ref 19–32)
Calcium: 9.8 mg/dL (ref 8.4–10.5)
Creatinine, Ser: 0.76 mg/dL (ref 0.50–1.10)
GFR calc Af Amer: >90 mL/min (ref >90)
Glucose, Bld: 88 mg/dL (ref 70–99)
POTASSIUM: 3.9 meq/L (ref 3.7–5.3)
Sodium: 141 mEq/L (ref 137–147)
Total Bilirubin: 0.6 mg/dL (ref 0.3–1.2)
Total Protein: 8.2 g/dL (ref 6.0–8.3)

## 2014-03-03 LAB — RAPID URINE DRUG SCREEN, HOSP PERFORMED
AMPHETAMINES: NOT DETECTED
Barbiturates: NOT DETECTED
Benzodiazepines: NOT DETECTED
Cocaine: NOT DETECTED
Opiates: NOT DETECTED
TETRAHYDROCANNABINOL: POSITIVE — AB

## 2014-03-03 LAB — ACETAMINOPHEN LEVEL: Acetaminophen (Tylenol), Serum: <15 ug/mL (ref 10–30)

## 2014-03-03 LAB — SALICYLATE LEVEL

## 2014-03-03 LAB — ETHANOL: Alcohol, Ethyl (B): <11 mg/dL (ref 0–11)

## 2014-03-03 MED ORDER — NICOTINE 21 MG/24HR TD PT24: 21.0000 mg | MEDICATED_PATCH | Freq: Every day | TRANSDERMAL | Status: DC

## 2014-03-03 MED ORDER — ACETAMINOPHEN 325 MG PO TABS
650.0000 mg | ORAL_TABLET | Freq: Once | ORAL | Status: AC
  Administered 2014-03-03: 650 mg via ORAL
  Filled 2014-03-03: qty 2

## 2014-03-03 MED ORDER — IBUPROFEN 200 MG PO TABS
600.0000 mg | ORAL_TABLET | Freq: Three times a day (TID) | ORAL | Status: DC | PRN
  Administered 2014-03-04: 600 mg via ORAL
  Filled 2014-03-03: qty 3

## 2014-03-03 MED ORDER — ALUM & MAG HYDROXIDE-SIMETH 200-200-20 MG/5ML PO SUSP: 30.0000 mL | ORAL | Status: DC | PRN

## 2014-03-03 MED ORDER — ONDANSETRON HCL 4 MG PO TABS: 4.0000 mg | ORAL_TABLET | Freq: Three times a day (TID) | ORAL | Status: DC | PRN

## 2014-03-03 MED ORDER — ZOLPIDEM TARTRATE 5 MG PO TABS
5.0000 mg | ORAL_TABLET | Freq: Every evening | ORAL | Status: DC | PRN
  Administered 2014-03-03: 5 mg via ORAL
  Filled 2014-03-03: qty 1

## 2014-03-03 MED ORDER — LORAZEPAM 1 MG PO TABS
1.0000 mg | ORAL_TABLET | Freq: Three times a day (TID) | ORAL | Status: DC | PRN
  Administered 2014-03-03: 1 mg via ORAL
  Filled 2014-03-03: qty 1

## 2014-03-03 NOTE — BH Assessment (Signed)
Per, Renata Capriceonrad -NP patient meets criteia for inpatient hospitalization. [facility] due to the patient stating that she has a traumatic brain injury.

## 2014-03-03 NOTE — ED Notes (Signed)
Patient called out for pain meds. RN notified

## 2014-03-03 NOTE — ED Provider Notes (Signed)
CSN: [id number]     Arrival date & time 03/03/14  1529 History  This chart was scribed for Celene Skeenobyn Karielle Davidow, PA-C working with Toy BakerAnthony T Allen, MD by Richarda Overlieichard Holland, ED Scribe. This patient was seen in room [facility]3/[facility]3 and the patient's care was started 4:15 PM.     Chief Complaint  Patient presents with  . Medical Clearance    The history is provided by the patient. No language interpreter was used.    HPI Comments: Angela Andrade is a 33 y.o. female who presents to the Emergency Department with her therapist for medical clearance and psych eval. 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 Pt was sent here from behavioral health for medical clearance. She reports her mood has been changing frequently and is having "mood swings". States she has been "up up up on a high level" recently and cannot "come down". Denies drug or alcohol use. She reports she has not been sleeping or eating well. She denies suicidal or homicidal ideations. Per nursing, pt has last 40 pounds in the past 2 months, and has not slept in 6 days. Pt reports she is on no medication for psych issues and feels she needs to be. Denies lightheadedness, dizziness, speech or vision changes.   Past Medical History  Diagnosis Date  . Anemia   . Anxiety   . Depression   . Arthritis    Past Surgical History  Procedure Laterality Date  . Pelvic fracture surgery    . Hip fracture surgery    . Coma    . Spleen repair    . Brain injury     Family History  Problem Relation Age of Onset  . Cancer Mother   . Hypertension Mother   . Diabetes Maternal Aunt   . Hypertension Maternal Aunt   . Diabetes Maternal Uncle   . Hypertension Maternal Uncle   . Cancer Maternal Grandmother   . Diabetes Maternal Grandmother   . Hypertension Maternal Grandmother    History  Substance Use Topics  . Smoking status: Former Smoker -- 0.25 packs/day   Types: Cigarettes    Quit date: 07/06/2011  . Smokeless tobacco: Not on file  . Alcohol Use: No   OB History    Gravida Para Term Preterm AB TAB SAB Ectopic Multiple Living   3 3 3       3      Review of Systems  Neurological: Positive for headaches.  Psychiatric/Behavioral: Positive for sleep disturbance and dysphoric mood. The patient is nervous/anxious.   All other systems reviewed and are negative.     Allergies  Hydrocodone; Morphine and related; and Zyprexa  Home Medications   Prior to Admission medications   Medication Sig Start Date End Date Taking? Authorizing Provider  ibuprofen (ADVIL,MOTRIN) 800 MG tablet Take 1 tablet (800 mg total) by mouth every 8 (eight) hours as needed. 10/17/13   Brock Badharles A Harper, MD  medroxyPROGESTERone (PROVERA) 10 MG tablet Take 2 tablets (20 mg total) by mouth daily. 12/10/13   Brock Badharles A Harper, MD  Oxycodone HCl 10 MG TABS Take 1 tablet (10 mg total) by mouth every 6 (six) hours as needed. 01/21/14   Brock Badharles A Harper, MD   BP 126/71 mmHg  Pulse 85  Temp(Src) 98 F (36.7 C) (Oral)  Resp 17  Ht 5' 3.25" (1.607 m)  Wt 130 lb (58.968 kg)  BMI 22.83 kg/m2  SpO2 98%  LMP 02/12/2014 Physical Exam  Constitutional: She is oriented to person, place, and time. She appears well-developed and well-nourished. No distress.  HENT:  Head: Normocephalic and atraumatic.  Mouth/Throat: Oropharynx is clear and moist.  Eyes: Conjunctivae and EOM are normal. Pupils are equal, round, and reactive to light.  Neck: Normal range of motion. Neck supple.  Cardiovascular: Normal rate, regular rhythm, normal heart sounds and intact distal pulses.   Pulmonary/Chest: Effort normal and breath sounds normal. No respiratory distress.  Abdominal: Soft. Bowel sounds are normal. There is no tenderness.  Musculoskeletal: Normal range of motion. She exhibits no edema.  Neurological: She is alert and oriented to person, place, and time. She has normal strength. No cranial  nerve deficit or sensory deficit. Coordination and gait normal.  Speech fluent. Moves limbs without ataxia. Equal grip strength bilateral.  Skin: Skin is warm and dry. No rash noted. She is not diaphoretic.  Psychiatric: Her mood appears anxious. Her speech is tangential. Her speech is not slurred. She exhibits a depressed mood. She expresses no homicidal and no suicidal ideation.  Tearful. Manic.  Nursing note and vitals reviewed.   ED Course  Procedures  DIAGNOSTIC STUDIES: Oxygen Saturation is 100% on RA, normal by my interpretation.    COORDINATION OF CARE: 4:22 PM Discussed treatment plan with pt at bedside and pt agreed to plan.   Labs Review Labs Reviewed  CBC - Abnormal; Notable for the following:    Hemoglobin 11.8 (*)    MCV 75.3 (*)    MCH 24.5 (*)    All other components within normal limits  SALICYLATE LEVEL - Abnormal; Notable for the following:    Salicylate Lvl <2.0 (*)    All other components within normal limits  URINE RAPID DRUG SCREEN (HOSP PERFORMED) - Abnormal; Notable for the following:    Tetrahydrocannabinol POSITIVE (*)    All other components within normal limits  ACETAMINOPHEN LEVEL  COMPREHENSIVE METABOLIC PANEL  ETHANOL    Imaging Review Ct Head Wo Contrast  03/03/2014   CLINICAL DATA:  Headache. Psychiatric evaluation. Medical clearance. Remote history of traumatic brain injury.  EXAM: CT HEAD WITHOUT CONTRAST  TECHNIQUE: Contiguous axial images were obtained from the base of the skull through the vertex without intravenous contrast.  COMPARISON:  09/18/2013.  FINDINGS: No mass lesion, mass effect, midline shift, hydrocephalus, hemorrhage. No territorial ischemia or acute infarction. Anterior RIGHT temporal lobe and inferior RIGHT frontal lobe encephalomalacia is present, consistent with prior head trauma.  The calvarium is intact. Visible paranasal sinuses are normal. Scout image appears normal.  IMPRESSION: 1. No acute intracranial abnormality.  2. 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   Electronically Signed   By: Andreas NewportGeoffrey  Lamke M.D.   On: 03/03/2014 17:06     EKG Interpretation None      MDM   Final diagnoses:  Headache   [facility]. Hx of TBI, complaining of headache. No focal neuro deficits. Head CT negative. No SI/HI. Medically cleared for psych eval. Moved to psych ED.  I personally performed the services described in this documentation, which was scribed in my presence. The recorded information has been reviewed and is accurate.     Kathrynn SpeedRobyn M Athalie Newhard, PA-C 03/04/14 1005

## 2014-03-03 NOTE — ED Notes (Signed)
Pt visiting with family member, AAO x 3, no distress, will continue to monitor for safety.

## 2014-03-03 NOTE — ED Notes (Signed)
[facility], hx of TBI. Pt is manic, has lost 40 pounds in 2 months. Cried through whole assessment. Reports she has not slept in 6 days. Pt needs med clearance and then placement, [facility].

## 2014-03-04 ENCOUNTER — Inpatient Hospital Stay (HOSPITAL_COMMUNITY)
Admission: AD | Admit: 2014-03-04 | Discharge: 2014-03-09 | DRG: 885 | Disposition: A | Discharge status: Home / Self Care | Payer: Medicare Other | Source: Intra-hospital | Attending: Psychiatry | Admitting: Psychiatry

## 2014-03-04 ENCOUNTER — Encounter (HOSPITAL_COMMUNITY): Payer: Self-pay | Admitting: Registered Nurse

## 2014-03-04 ENCOUNTER — Encounter (HOSPITAL_COMMUNITY): Payer: Self-pay | Admitting: *Deleted

## 2014-03-04 DIAGNOSIS — F3162 Bipolar disorder, current episode mixed, moderate: Secondary | ICD-10-CM | POA: Diagnosis present

## 2014-03-04 DIAGNOSIS — F3160 Bipolar disorder, current episode mixed, unspecified: Secondary | ICD-10-CM | POA: Diagnosis present

## 2014-03-04 DIAGNOSIS — F39 Unspecified mood [affective] disorder: Secondary | ICD-10-CM

## 2014-03-04 DIAGNOSIS — Z87891 Personal history of nicotine dependence: Secondary | ICD-10-CM | POA: Diagnosis not present

## 2014-03-04 DIAGNOSIS — F329 Major depressive disorder, single episode, unspecified: Secondary | ICD-10-CM | POA: Diagnosis present

## 2014-03-04 MED ORDER — MAGNESIUM HYDROXIDE 400 MG/5ML PO SUSP: 30.0000 mL | Freq: Every day | ORAL | Status: DC | PRN

## 2014-03-04 MED ORDER — ACETAMINOPHEN 325 MG PO TABS: 650.0000 mg | ORAL_TABLET | Freq: Four times a day (QID) | ORAL | Status: DC | PRN

## 2014-03-04 MED ORDER — QUETIAPINE FUMARATE 100 MG PO TABS: 100.0000 mg | ORAL_TABLET | Freq: Every day | ORAL | Status: DC

## 2014-03-04 MED ORDER — PROMETHAZINE HCL 25 MG PO TABS
25.0000 mg | ORAL_TABLET | Freq: Once | ORAL | Status: AC
  Administered 2014-03-04: 25 mg via ORAL
  Filled 2014-03-04: qty 1

## 2014-03-04 MED ORDER — QUETIAPINE FUMARATE 100 MG PO TABS
100.0000 mg | ORAL_TABLET | Freq: Every day | ORAL | Status: DC
  Administered 2014-03-04: 100 mg via ORAL
  Filled 2014-03-04 (×4): qty 1

## 2014-03-04 MED ORDER — CARBAMAZEPINE ER 200 MG PO TB12
200.0000 mg | ORAL_TABLET | Freq: Two times a day (BID) | ORAL | Status: DC
Start: 2014-03-04 — End: 2014-03-04
  Administered 2014-03-04: 200 mg via ORAL
  Filled 2014-03-04 (×2): qty 1

## 2014-03-04 MED ORDER — LORAZEPAM 2 MG/ML IJ SOLN
1.0000 mg | Freq: Once | INTRAMUSCULAR | Status: AC
  Administered 2014-03-04: 1 mg via INTRAMUSCULAR
  Filled 2014-03-04: qty 1

## 2014-03-04 MED ORDER — CARBAMAZEPINE ER 200 MG PO TB12
200.0000 mg | ORAL_TABLET | Freq: Two times a day (BID) | ORAL | Status: DC
Start: 2014-03-04 — End: 2014-03-05
  Administered 2014-03-04 – 2014-03-05 (×2): 200 mg via ORAL
  Filled 2014-03-04 (×8): qty 1

## 2014-03-04 NOTE — BH Assessment (Signed)
[facility] by Dr. Jannifer FranklinAkintayo and Assunta FoundShuvon Rankin, NP. The bed assignment is 305-1. Nursing report # [id number]. Patient is voluntary and Juel [facility] will provide transport.

## 2014-03-04 NOTE — Progress Notes (Signed)
Patient ID: Angela Andrade, female   DOB: 02/06/1981, 33 y.o.   MRN: [id number]  PER STATE REGULATIONS 482.30  THIS CHART WAS REVIEWED FOR MEDICAL NECESSITY WITH RESPECT TO THE PATIENT'S ADMISSION/DURATION OF STAY.  NEXT REVIEW DATE: 03/07/14   [name], RN, BSN CASE MANAGER

## 2014-03-04 NOTE — ED Notes (Signed)
Patient requested medication for headache this morning.  Patient is concerned about a tubal ligation surgery that is [facility] to [facility].  She denies any SI/HI/AVH.  Patient stated, "I feel weird.  I need to know where they are going to place me.  I need to get help so I can be with my kids." "Im down and cry all the time or I am up."   Patient thought that someone could come over here and do her tubal ligation.  She appears anxious with somatic complaints.  Patient states she cannot eat certain foods due to "some pain in my intestine."  Continue to assess pain and mood.

## 2014-03-04 NOTE — ED Notes (Signed)
Patient given 1 mg ativan IM for severe anxiety and agitation.  Patient yelling in hallway to be discharged.  Patient felt we were taking an excessive amount of time to be transferred across the street.  She was also angry because she could not smoke.

## 2014-03-04 NOTE — Progress Notes (Signed)
Report was given to Donovan KailPatrice W. RN at 1615 hrs.

## 2014-03-04 NOTE — Progress Notes (Signed)
Adult Psychoeducational Group Note  Date:  03/04/2014 Time:  8:46 PM  Group Topic/Focus:  Wrap-Up Group:   The focus of this group is to help patients review their daily goal of treatment and discuss progress on daily workbooks.  Participation Level:  Active  Participation Quality:  Appropriate  Affect:  Appropriate  Cognitive:  Appropriate  Insight: Appropriate  Engagement in Group:  Engaged  Modes of Intervention:  Discussion  Additional Comments:The patient just arrived and did not attend group  Octavio Mannshigpen, Koren Sermersheim Lee 03/04/2014, 8:46 PM

## 2014-03-04 NOTE — Consult Note (Signed)
[facility] Consult   Reason for Consult:  Mood swings Referring Physician:  EDP  Angela Andrade is an 33 y.o. female. Total Time spent with patient: 45 minutes  Assessment: AXIS I:  Mood Disorder NOS AXIS II:  Deferred AXIS III:   Past Medical History  Diagnosis Date  . Anemia   . Anxiety   . Depression   . Arthritis    AXIS IV:  other psychosocial or environmental problems AXIS V:  41-50 serious symptoms  Plan:  Recommend psychiatric Inpatient admission when medically cleared.  Subjective:   Angela Andrade is a 33 y.o. female patient presents to [facility] with complaints of mood swings.  Patient states "I can't function with my everyday life.  I need some medicine to help me.  I am having extremely highs, lows, angry, irritable. I can't sleep , or eat."  Patient is tearful."  Patient states that she just wants to feel normal so that she can spend time with her children.  States that she hates feeling like this and want to be a part of the things that are happening in their lives.      HPI Elements:   Location:  mood swings. Quality:  anger. Severity:  irritability    . Timing:  1 day. Review of Systems  Psychiatric/Behavioral: Positive for depression and substance abuse. Negative for suicidal ideas. The patient is nervous/anxious and has insomnia.   All other systems reviewed and are negative.  Family History  Problem Relation Age of Onset  . Cancer Mother   . Hypertension Mother   . Diabetes Maternal Aunt   . Hypertension Maternal Aunt   . Diabetes Maternal Uncle   . Hypertension Maternal Uncle   . Cancer Maternal Grandmother   . Diabetes Maternal Grandmother   . Hypertension Maternal Grandmother     Past Psychiatric History: Past Medical History  Diagnosis Date  . Anemia   . Anxiety   . Depression   . Arthritis     reports that she quit smoking about 2 years ago. Her smoking use included Cigarettes. She smoked 0.25 packs per day. She does not have  any smokeless tobacco history on file. She reports that she does not drink alcohol or use illicit drugs. Family History  Problem Relation Age of Onset  . Cancer Mother   . Hypertension Mother   . Diabetes Maternal Aunt   . Hypertension Maternal Aunt   . Diabetes Maternal Uncle   . Hypertension Maternal Uncle   . Cancer Maternal Grandmother   . Diabetes Maternal Grandmother   . Hypertension Maternal Grandmother            Allergies:   Allergies  Allergen Reactions  . Hydrocodone Itching    Ok to take with benadryl  . Morphine And Related Itching    Ok to take with benadryl  . Zyprexa [Olanzapine] Other (See Comments)    hallucinations    ACT Assessment Complete:  Yes:    Educational Status    Risk to Self: Risk to self with the past 6 months Is patient at risk for suicide?: No, but patient needs Medical Clearance Substance abuse history and/or treatment for substance abuse?: No  Risk to Others:    Abuse:    Prior Inpatient Therapy:    Prior Outpatient Therapy:    Additional Information:                    Objective: Blood pressure 116/56, pulse  58, temperature 98.4 F (36.9 C), temperature source Oral, resp. rate 14, height 5' 3.25" (1.607 m), weight 58.968 kg (130 lb), last menstrual period 02/12/2014, SpO2 100 %, not currently breastfeeding.Body mass index is 22.83 kg/(m^2). Results for orders placed or performed during the hospital encounter of 03/03/14 (from the past 72 hour(s))  Acetaminophen level     Status: None   Collection Time: 03/03/14  3:38 PM  Result Value Ref Range   Acetaminophen (Tylenol), Serum <15.0 10 - 30 ug/mL    Comment:        THERAPEUTIC CONCENTRATIONS VARY SIGNIFICANTLY. A RANGE OF 10-30 ug/mL MAY BE AN EFFECTIVE CONCENTRATION FOR MANY PATIENTS. HOWEVER, SOME ARE BEST TREATED AT CONCENTRATIONS OUTSIDE THIS RANGE. ACETAMINOPHEN CONCENTRATIONS >150 ug/mL AT 4 HOURS AFTER INGESTION AND >50 ug/mL AT 12 HOURS AFTER INGESTION  ARE OFTEN ASSOCIATED WITH TOXIC REACTIONS.   CBC     Status: Abnormal   Collection Time: 03/03/14  3:38 PM  Result Value Ref Range   WBC 7.6 4.0 - 10.5 K/uL   RBC 4.81 3.87 - 5.11 MIL/uL   Hemoglobin 11.8 (L) 12.0 - 15.0 g/dL   HCT 36.2 36.0 - 46.0 %   MCV 75.3 (L) 78.0 - 100.0 fL   MCH 24.5 (L) 26.0 - 34.0 pg   MCHC 32.6 30.0 - 36.0 g/dL   RDW 14.5 11.5 - 15.5 %   Platelets 298 150 - 400 K/uL  Comprehensive metabolic panel     Status: None   Collection Time: 03/03/14  3:38 PM  Result Value Ref Range   Sodium 141 137 - 147 mEq/L   Potassium 3.9 3.7 - 5.3 mEq/L   Chloride 103 96 - 112 mEq/L   CO2 23 19 - 32 mEq/L   Glucose, Bld 88 70 - 99 mg/dL   BUN 8 6 - 23 mg/dL   Creatinine, Ser 0.76 0.50 - 1.10 mg/dL   Calcium 9.8 8.4 - 10.5 mg/dL   Total Protein 8.2 6.0 - 8.3 g/dL   Albumin 4.8 3.5 - 5.2 g/dL   AST 16 0 - 37 U/L   ALT 8 0 - 35 U/L   Alkaline Phosphatase 49 39 - 117 U/L   Total Bilirubin 0.6 0.3 - 1.2 mg/dL   GFR calc non Af Amer >90 >90 mL/min   GFR calc Af Amer >90 >90 mL/min    Comment: (NOTE) The eGFR has been calculated using the CKD EPI equation. This calculation has not been validated in all clinical situations. eGFR's persistently <90 mL/min signify possible Chronic Kidney Disease.    Anion gap 15 5 - 15  Ethanol (ETOH)     Status: None   Collection Time: 03/03/14  3:38 PM  Result Value Ref Range   Alcohol, Ethyl (B) <11 0 - 11 mg/dL    Comment:        LOWEST DETECTABLE LIMIT FOR SERUM ALCOHOL IS 11 mg/dL FOR MEDICAL PURPOSES ONLY   Salicylate level     Status: Abnormal   Collection Time: 03/03/14  3:38 PM  Result Value Ref Range   Salicylate Lvl <4.0 (L) 2.8 - 20.0 mg/dL  Urine Drug Screen     Status: Abnormal   Collection Time: 03/03/14  3:47 PM  Result Value Ref Range   Opiates NONE DETECTED NONE DETECTED   Cocaine NONE DETECTED NONE DETECTED   Benzodiazepines NONE DETECTED NONE DETECTED   Amphetamines NONE DETECTED NONE DETECTED    Tetrahydrocannabinol POSITIVE (A) NONE DETECTED   Barbiturates NONE DETECTED  NONE DETECTED    Comment:        DRUG SCREEN FOR MEDICAL PURPOSES ONLY.  IF CONFIRMATION IS NEEDED FOR ANY PURPOSE, NOTIFY LAB WITHIN 5 DAYS.        LOWEST DETECTABLE LIMITS FOR URINE DRUG SCREEN Drug Class       Cutoff (ng/mL) Amphetamine      1000 Barbiturate      200 Benzodiazepine   829 Tricyclics       562 Opiates          300 Cocaine          300 THC              50    Labs are reviewed see above values.  Medications reviewed and Seroquel 100 mg at bed time and Tegretol XR 200 mg BID added  Current Facility-Administered Medications  Medication Dose Route Frequency Provider Last Rate Last Dose  . alum & mag hydroxide-simeth (MAALOX/MYLANTA) 200-200-20 MG/5ML suspension 30 mL  30 mL Oral PRN Robyn M Hess, PA-C      . carbamazepine (TEGRETOL XR) 12 hr tablet 200 mg  200 mg Oral BID Litsy Epting   200 mg at 03/04/14 1259  . ibuprofen (ADVIL,MOTRIN) tablet 600 mg  600 mg Oral Q8H PRN Carman Ching, PA-C   600 mg at 03/04/14 1308  . ondansetron (ZOFRAN) tablet 4 mg  4 mg Oral Q8H PRN Robyn M Hess, PA-C      . QUEtiapine (SEROQUEL) tablet 100 mg  100 mg Oral QHS Doralene Glanz       Current Outpatient Prescriptions  Medication Sig Dispense Refill  . ibuprofen (ADVIL,MOTRIN) 800 MG tablet Take 1 tablet (800 mg total) by mouth every 8 (eight) hours as needed. 30 tablet 5  . medroxyPROGESTERone (PROVERA) 10 MG tablet Take 2 tablets (20 mg total) by mouth daily. 20 tablet 0  . Oxycodone HCl 10 MG TABS Take 1 tablet (10 mg total) by mouth every 6 (six) hours as needed. 40 tablet 0    Psychiatric Specialty Exam:     Blood pressure 116/56, pulse 58, temperature 98.4 F (36.9 C), temperature source Oral, resp. rate 14, height 5' 3.25" (1.607 m), weight 58.968 kg (130 lb), last menstrual period 02/12/2014, SpO2 100 %, not currently breastfeeding.Body mass index is 22.83 kg/(m^2).  General Appearance:  Casual  Eye Contact::  Good  Speech:  Clear and Coherent and Normal Rate  Volume:  Increased  Mood:  Angry, Anxious, Depressed, Hopeless and Irritable  Affect:  Congruent, Depressed and Full Range  Thought Process:  Circumstantial and Goal Directed  Orientation:  Full (Time, Place, and Person)  Thought Content:  Rumination  Suicidal Thoughts:  No  Homicidal Thoughts:  No  Memory:  Immediate;   Good Recent;   Good Remote;   Good  Judgement:  Fair  Insight:  Lacking  Psychomotor Activity:  Normal  Concentration:  Fair  Recall:  Good  Fund of Knowledge:Good  Language: Good  Akathisia:  No  Handed:  Right  AIMS (if indicated):     Assets:  Communication Skills Desire for Improvement Housing Social Support  Sleep:      Musculoskeletal: Strength & Muscle Tone: within normal limits Gait & Station: normal Patient leans: N/A  Treatment Plan Summary: Daily contact with patient to assess and evaluate symptoms and progress in treatment Medication management Inpatient treatment recommended   Patient accepted to [facility] 305/01   Rankin, Shuvon, FNP-BC 03/04/2014 2:28 PM  Patient seen, evaluated and I agree with notes by Nurse Practitioner. Corena Pilgrim, MD

## 2014-03-04 NOTE — Tx Team (Signed)
Initial Interdisciplinary Treatment Plan   PATIENT STRESSORS: Medication change or noncompliance   PROBLEM LIST: Problem List/Patient Goals Date to be addressed Date deferred Reason deferred Estimated date of resolution  depression 03/04/2014     Mood instability 03/04/2014                                                DISCHARGE CRITERIA:  Improved stabilization in mood, thinking, and/or behavior Need for constant or close observation no longer present  PRELIMINARY DISCHARGE PLAN: Outpatient therapy Return to previous living arrangement  PATIENT/FAMIILY INVOLVEMENT: This treatment plan has been presented to and reviewed with the patient, Angela Andrade.  The patient and family have been given the opportunity to ask questions and make suggestions.  Leighton Parodyyson, Sanaia Jasso M 03/04/2014, 6:56 PM

## 2014-03-04 NOTE — ED Notes (Addendum)
Patient became angry, tearful and upset when treatment team was assessing her.  She states her mood is unstable and she needs to get help.  Patient has 3 children that she cares for.  She states, "I just want to get better for them." Patient became so anxious she threw up in her trash can.  A one time order for phenergan was given with effective results.  Patient denies any SI/HI/AVH.  She states, "I just want to get stabilized."

## 2014-03-05 ENCOUNTER — Encounter (HOSPITAL_COMMUNITY): Payer: Self-pay | Admitting: Psychiatry

## 2014-03-05 DIAGNOSIS — F3160 Bipolar disorder, current episode mixed, unspecified: Secondary | ICD-10-CM | POA: Diagnosis present

## 2014-03-05 MED ORDER — TRAZODONE HCL 50 MG PO TABS
50.0000 mg | ORAL_TABLET | Freq: Every day | ORAL | Status: DC
  Administered 2014-03-05 – 2014-03-08 (×4): 50 mg via ORAL
  Filled 2014-03-05 (×5): qty 1

## 2014-03-05 MED ORDER — HALOPERIDOL LACTATE 5 MG/ML IJ SOLN
2.0000 mg | Freq: Three times a day (TID) | INTRAMUSCULAR | Status: DC | PRN
  Administered 2014-03-08: 2 mg via INTRAMUSCULAR

## 2014-03-05 MED ORDER — ACETAMINOPHEN 500 MG PO TABS
1000.0000 mg | ORAL_TABLET | Freq: Four times a day (QID) | ORAL | Status: DC | PRN
  Administered 2014-03-05 – 2014-03-09 (×6): 1000 mg via ORAL
  Filled 2014-03-05 (×6): qty 2

## 2014-03-05 MED ORDER — CLONAZEPAM 1 MG PO TABS
1.0000 mg | ORAL_TABLET | Freq: Two times a day (BID) | ORAL | Status: DC
  Administered 2014-03-05 – 2014-03-09 (×8): 1 mg via ORAL
  Filled 2014-03-05 (×8): qty 1

## 2014-03-05 MED ORDER — OLANZAPINE 5 MG PO TBDP
5.0000 mg | ORAL_TABLET | Freq: Four times a day (QID) | ORAL | Status: DC | PRN
  Administered 2014-03-05 – 2014-03-08 (×3): 5 mg via ORAL
  Filled 2014-03-05 (×2): qty 1
  Filled 2014-03-05: qty 20
  Filled 2014-03-05: qty 1

## 2014-03-05 MED ORDER — HALOPERIDOL 1 MG PO TABS
2.0000 mg | ORAL_TABLET | Freq: Three times a day (TID) | ORAL | Status: DC | PRN
  Administered 2014-03-05: 2 mg via ORAL
  Filled 2014-03-05 (×2): qty 4

## 2014-03-05 MED ORDER — CARBAMAZEPINE ER 200 MG PO TB12
200.0000 mg | ORAL_TABLET | Freq: Three times a day (TID) | ORAL | Status: DC
  Administered 2014-03-05 – 2014-03-09 (×12): 200 mg via ORAL
  Filled 2014-03-05 (×16): qty 1

## 2014-03-05 MED ORDER — OLANZAPINE 5 MG PO TBDP
5.0000 mg | ORAL_TABLET | Freq: Once | ORAL | Status: AC
  Administered 2014-03-05: 5 mg via ORAL
  Filled 2014-03-05 (×2): qty 1
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
## 2014-03-05 NOTE — Progress Notes (Signed)
D: Pt denies SI/HI/AVH. Pt is very labile, argumentative, and blaming others. Pt complains about everything that does not go her way then she blames everyone involved and calls everyone stupid. Pt has very simple / concrete way of thinking. Pt does not want to hear what you have to say if it does not correlate with her thinking. Pt appears very closed minded.  A: Pt was offered support and encouragement. Pt was given scheduled medications. Pt was encourage to attend groups. Q 15 minute checks were done for safety.   R:Pt attends groups and interacts well with peers and staff. Pt is taking medication. Pt receptive to treatment and safety maintained on unit.

## 2014-03-05 NOTE — [facility] Group Notes (Signed)
[facility] Group Notes:  (Nursing/MHT/Case Management/Adjunct)  Date:  03/05/2014  Time:  9:58 AM  Type of Therapy:  Nurse Education  Participation Level:  Active  Participation Quality:  Attentive  Affect:  Appropriate  Cognitive:  Appropriate  Insight:  Good  Engagement in Group:  Engaged  Modes of Intervention:  Discussion and Education  Summary of Progress/Problems: The focus of this group is leisure and lifestyle changes. The group explores ways to change current lifestyle and think of leisure activities. Patient shared some of her lifestyle changes that she plans to make. Patient asked questions about medication and was attentive. Patient appeared to enjoy group.  Marzetta BoardDopson, Rolinda Impson E 03/05/2014, 9:58 AM

## 2014-03-05 NOTE — Progress Notes (Signed)
Patient is irritable this morning, angry at times. Continues to ask for ativan and is not open to using coping skills, other available meds. Reports unstable mood. Pt offered support and reassurance. Encouraged to use coping skills to manage anxiety. Discussed care with MD. She denies SI/HI/AVH. No pain or physical problems. She remains safe. Lawrence MarseillesFriedman, Kylan Veach Eakes

## 2014-03-05 NOTE — Plan of Care (Signed)
Problem: Ineffective individual coping Goal: STG: Patient will remain free from self harm Outcome: Progressing Patient has not engaged in self harm and denies SI. Goal: STG:Pt. will utilize relaxation techniques to reduce stress STG: Patient will utilize relaxation techniques to reduce stress levels  Outcome: Not Progressing Patient continues to ask for ativan to temper her anxiety. Is not open to utilizing coping skills.

## 2014-03-05 NOTE — Progress Notes (Signed)
Adult Psychoeducational Group Note  Date:  03/05/2014 Time:  8:42 PM  Group Topic/Focus:  Wrap-Up Group:   The focus of this group is to help patients review their daily goal of treatment and discuss progress on daily workbooks.  Participation Level:  Active  Participation Quality:  Appropriate  Affect:  Appropriate  Cognitive:  Appropriate  Insight: Appropriate  Engagement in Group:  Engaged  Modes of Intervention:  Discussion  Additional Comments:The patient expressed that group was about life style change.The patient also said that she feel if your health not right your life not right. Octavio Mannshigpen, Markisha Meding Lee 03/05/2014, 8:42 PM

## 2014-03-05 NOTE — [facility] Group Notes (Signed)
[facility] LCSW Group Therapy  03/05/2014 1:47 PM   Type of Therapy:  Group Therapy  Participation Level:  Did not attend.  Making a ruckus in the hall.  Summary of Progress/Problems: Today's group focused on relapse prevention.  We defined the term, and then brainstormed on ways to prevent relapse.  Daryel Geraldorth, Cayle Thunder B 03/05/2014 , 1:47 PM

## 2014-03-05 NOTE — Progress Notes (Signed)
Patient yelling, screaming, demanding to leave at 1400. Upset that a provider had not yet seen her. "I just don't matter. Why are discharges more important than me?" Patient unable to be reasoned with. NP and Dub MikesLugo, MD called and spent time with patient de-escalating. Orders received and klonopin 1mg  and zyprexa prn given. Patient much calmer after 1:1 with NP. Tearful and remorseful for behavior. Reassurance given to patient. Will continue to monitor closely and offer support often. Lawrence MarseillesFriedman, Cheyne Bungert Eakes

## 2014-03-05 NOTE — Progress Notes (Signed)
D Pt. Denies SI and HI, no complaints of pain or discomfort noted  A Writer offered support and encouragement, discussed medications with pt.  R Pt. Remains safe on the unit.  Pt. Was in bed but  appeared agitated on approach. Pt. Started reminiscing about her morning and reporting she had a bad day.  Writer offered pt. Her zyprexa which she accepted.  Pt. Wanted her trazodone for sleep and was also requesting additional medication.  Pt. Then offered her haldol.  Pt. Is presently resting quietly.

## 2014-03-05 NOTE — [facility] Suicide Risk Assessment (Signed)
[facility] INPATIENT:  Family/Significant Other Suicide Prevention Education  Suicide Prevention Education:  Patient Refusal for Family/Significant Other Suicide Prevention Education: The patient Angela Andrade has refused to provide written consent for family/significant other to be provided Family/Significant Other Suicide Prevention Education during admission and/or prior to discharge.  Physician notified.  Pt stated that she does not have any supportive friends or family. Her children are all under 18. SPE completed with pt. SPI pamphlet also provided to pt and she was encouraged to share information with support network, ask questions, and talk about any concerns relating to SPE.  Smart, Pairlee Sawtell LCSWA  03/05/2014, 3:49 PM
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
## 2014-03-06 LAB — TSH: TSH: 1.82 u[IU]/mL (ref 0.350–4.500)

## 2014-03-06 MED ORDER — OLANZAPINE 5 MG PO TBDP
5.0000 mg | ORAL_TABLET | Freq: Every day | ORAL | Status: DC
  Administered 2014-03-06: 5 mg via ORAL
  Filled 2014-03-06 (×4): qty 1
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
## 2014-03-06 NOTE — [facility] Group Notes (Addendum)
[facility] LCSW Group Therapy  03/06/2014 1:24 PM  Type of Therapy:  Group Therapy  Participation Level:  Active  Participation Quality:  Appropriate  Affect:  Appropriate  Cognitive:  Appropriate  Insight:  Improving  Engagement in Therapy:  Improving  Modes of Intervention:  Discussion, Education, Socialization and Support  Summary of Progress/Problems: Chaplain was here to lead a group on themes of hope and courage. Angela Andrade stated she has not been a part of a supportive community. "In my neighborhood, if you leave me alone, I leave you alone, and we are good.  [place] live [place] the [place]."  She went on to describe that she does not trust easily and keeps others at a distance because her mom gave her up for adoption, "and all the people who raised me  wanted was a check."Angela Andrade believes she has found support here because she has something in common with people here. "We're here because we all have issues. She expressed her ability to trust some of the other patients because they do not judge here. Outside of here, she identifies her children as her main community.    [name],[name] 03/06/2014, 1:24 PM
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
## 2014-03-06 NOTE — Progress Notes (Signed)
Psychoeducational Group Note  Date:  03/06/2014 Time: 2210  Group Topic/Focus:  Wrap-Up Group:   The focus of this group is to help patients review their daily goal of treatment and discuss progress on daily workbooks.  Participation Level: Did Not Attend  Participation Quality:  Not Applicable  Affect:  Not Applicable  Cognitive:  Not Applicable  Insight:  Not Applicable  Engagement in Group: Not Applicable  Additional Comments:  The patient did not attend group this evening.   Yesha Muchow S 03/06/2014, 10:10 PM

## 2014-03-06 NOTE — [facility] Group Notes (Signed)
[facility]   03/06/2014 10:26 AM  Participation Quality:  Minimal   Mood/Affect:  Depressed and Flat  Depression Rating:  0  Anxiety Rating:  0  Thoughts of Suicide:  No Will you contract for safety?   NA  Current AVH:  No  Plan for Discharge/Comments: Pt nodded yes or no to questions and was resistant to speaking in group today. Pt reports no issues with medications and presents with depressed mood and flat/irritable affect. Pt appeared confused at times-at the end of group, tried to ask a question, then forgot what she was saying.    Transportation Means: unknown at this time   Supports: "my kids." no other friend or family supports   Smart, OncologistHeather LCSWA

## 2014-03-07 LAB — CARBAMAZEPINE LEVEL, TOTAL: Carbamazepine Lvl: 9 ug/mL (ref 4.0–12.0)

## 2014-03-07 MED ORDER — OLANZAPINE 10 MG PO TBDP
10.0000 mg | ORAL_TABLET | Freq: Every day | ORAL | Status: DC
  Administered 2014-03-07 – 2014-03-08 (×2): 10 mg via ORAL
  Filled 2014-03-07 (×3): qty 1

## 2014-03-07 NOTE — Progress Notes (Signed)
Patient ID: Angela Andrade, female   DOB: 04/02/1981, 33 y.o.   MRN: [id number] [facility]  03/07/2014 12:36 PM Angela Andrade  MRN:  [id number] Subjective:  Angela Andrade continues to some exhibit mood instability. Noted slight improvement today.  She was calm ans refrained from angry outbursts.  She continues to ruminates about her mother having given her up.  This is a great source of hurt and rejection for her.  Patient started to also write on her journal about her past.  She states that this is a way for her to let go of anger.  She expresses how much she loves her children and she would like to go home and be with them.    Diagnosis:  Mood instability, Depression  DSM5: Depressive Disorders:  Major Depressive Disorder - Moderate (296.22) Total Time spent with patient: 30 minutes  Axis I: Bipolar, mixed  ADL's:  Intact  Sleep: Fair  Appetite:  Fair  Psychiatric Specialty Exam: Physical Exam  Review of Systems  Constitutional: Negative.   HENT: Negative.   Eyes: Negative.   Respiratory: Negative.   Cardiovascular: Negative.   Gastrointestinal: Negative.   Genitourinary: Negative.   Musculoskeletal: Negative.   Skin: Negative.   Neurological: Negative.   Endo/Heme/Allergies: Negative.   Psychiatric/Behavioral: Positive for depression. The patient is nervous/anxious.     Blood pressure 110/80, pulse 75, temperature 98.4 F (36.9 C), temperature source Oral, resp. rate 16, height 5' 3.5" (1.613 m), weight 57.607 kg (127 lb), last menstrual period 02/12/2014, not currently breastfeeding.Body mass index is 22.14 kg/(m^2).  General Appearance: Fairly Groomed  Patent attorneyye Contact::  Fair  Speech:  Pressured  Volume:  fluctuates  Mood:  Anxious, Depressed, Dysphoric and Irritable  Affect:  Labile  Thought Process:  Coherent  Orientation:  Full (Time, Place, and Person)  Thought Content:  events from the past, a sense of hoplessness helplessness  Suicidal Thoughts:  No  Homicidal  Thoughts:  No  Memory:  Immediate;   Fair Recent;   Fair Remote;   Fair  Judgement:  Impaired  Insight:  Lacking  Psychomotor Activity:  Increased  Concentration:  Fair  Recall:  F[employer]Fair  Fund of Knowledge:NA  Language: Fair  Akathisia:  No  Handed:    AIMS (if indicated):     Assets:  Desire for Improvement  Sleep:  Number of Hours: 6.5   Musculoskeletal: Strength & Muscle Tone: within normal limits Gait & Station: normal Patient leans: N/A  Current Medications: Current Facility-Administered Medications  Medication Dose Route Frequency Provider Last Rate Last Dose  . acetaminophen (TYLENOL) tablet 1,000 mg  1,000 mg Oral Q6H PRN Lindwood QuaSheila May Estell Puccini, NP   1,000 mg at 03/06/14 1257  . carbamazepine (TEGRETOL XR) 12 hr tablet 200 mg  200 mg Oral TID Velna HatchetSheila May Giabella Duhart, NP   200 mg at 03/07/14 0835  . clonazePAM (KLONOPIN) tablet 1 mg  1 mg Oral BID Keeghan Bialy May Carson Bogden, NP   1 mg at 03/07/14 [id number]  . haloperidol (HALDOL) tablet 2 mg  2 mg Oral Q8H PRN Beau FannyJohn C Withrow, FNP   2 mg at 03/05/14 2125   Or  . haloperidol lactate (HALDOL) injection 2 mg  2 mg Intramuscular Q8H PRN Beau FannyJohn C Withrow, FNP      . magnesium hydroxide (MILK OF MAGNESIA) suspension 30 mL  30 mL Oral Daily PRN Shuvon Rankin, NP      . OLANZapine zydis (ZYPREXA) disintegrating tablet 10 mg  10 mg Oral  QHS Velna HatchetSheila May Jenel Gierke, NP      . OLANZapine zydis [facility](ZYPREXA) disintegrating tablet 5 mg  5 mg Oral QID PRN Lindwood QuaSheila May Ginni Eichler, NP   5 mg at 03/06/14 1258  . traZODone (DESYREL) tablet 50 mg  50 mg Oral QHS Michaelann Gunnoe May Khing Belcher, NP   50 mg at 03/06/14 2123    Lab Results:  Results for orders placed or performed during the hospital encounter of 03/04/14 (from the past 48 hour(s))  TSH     Status: None   Collection Time: 03/06/14  6:16 AM  Result Value Ref Range   TSH 1.820 0.350 - 4.500 uIU/mL    Comment[facility]  Carbamazepine level, total     Status: None   Collection Time: 03/07/14  6:30 AM   Result Value Ref Range   Carbamazepine Lvl 9.0 4.0 - 12.0 ug/mL    Comment: [facility]    Physical Findings: AIMS: Facial and Oral Movements Muscles of Facial Expression: None, normal Lips and Perioral Area: None, normal Jaw: None, normal Tongue: None, normal,Extremity Movements Upper (arms, wrists, hands, fingers): None, normal Lower (legs, knees, ankles, toes): None, normal, Trunk Movements Neck, shoulders, hips: None, normal, Overall Severity Severity of abnormal movements (highest score from questions above): None, normal Incapacitation due to abnormal movements: None, normal Patient's awareness of abnormal movements (rate only patient's report): No Awareness, Dental Status Current problems with teeth and/or dentures?: No Does patient usually wear dentures?: No  CIWA:    COWS:     Treatment Plan Summary: Daily contact with patient to assess and evaluate symptoms and progress in treatment Medication management  Plan: Supportive approach/coping skills/relapse prevention           Optimize response to psychotropics           Increase Zyprexa Zydis to 10 mg HS           Tegretol level - 9.0,  Continue with current dose of Tegretol  Medical Decision Making Problem Points:  Review of psycho-social stressors (1) Data Points:  Review of medication regiment & side effects (2)  I certify that inpatient services furnished can reasonably be expected to improve the patient's condition.   Adonis BrookAGUSTIN, Danely Bayliss MAY, AGNP-BC 03/07/2014, 12:36 PM
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
## 2014-03-07 NOTE — Progress Notes (Signed)
The focus of this group is to help patients review their daily goal of treatment and discuss progress on daily workbooks. Pt attended the evening group session but responded minimally to discussion prompts from the Writer. 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 Pt then refused a request from the Writer to sit down and remained standing for the duration of her time in group. Pt also required redirection to keep from loudly talking over her peers when they spoke. Pt told the Writer she did not have a very good day and that we could not give her what she needed. When the Writer asked what she needed, Pt responded, "I need a cigarette and a shot of grey goose. I need to be doped up." Frederica eventually left group early and did not return.

## 2014-03-07 NOTE — [facility] Group Notes (Signed)
[facility] LCSW Group Therapy  03/07/2014 12:45 PM  Type of Therapy:  Group Therapy  Participation Level:  Active  Participation Quality:  Attentive, Monopolizing and Redirectable  Affect:  Excited  Cognitive:  Appropriate  Insight:  Limited  Engagement in Therapy:  Developing/Improving and Distracting  Modes of Intervention:  Education  Summary of Progress/Problems: Today's group was about coping with life situations using a positive attitude. The group described attitude and identify both good and bad attitudes, the consequences and benefits of both.  Group further discussed which attitude to have and how to develop a good/positive attitude.  Closed by group sharing how each person can do something to have a better attitude.   Beverly Sessionsywan J Lindsey MSW, LCSW   Clide DalesHarrill, Catherine Campbell 03/07/2014, 12:45 PM

## 2014-03-07 NOTE — Progress Notes (Signed)
Angela Andrade is seen laughing and joking out in the milieu. She takes her meds as scheduled and she attends her  Groups as planned. She completed her morning assessment and on it she writes she denies SI within the past 24 hrs . She rates her depression, hopelesssness and  Anxiety "0/0/2", respectively.   A She takes her meds as planned and she says her H/A is " much better:."   R Safety is in place .

## 2014-03-07 NOTE — Progress Notes (Signed)
Psychoeducational Group Note  Date:  03/07/2014 Time: 1015  Group Topic/Focus:  Identifying Needs:   The focus of this group is to help patients identify their personal needs that have been historically problematic and identify healthy behaviors to address their needs.  Participation Level:  Minimal  Participation Quality:  Appropriate  Affect:  Appropriate  Cognitive:  Appropriate  Insight:  Engaged  Engagement in Group:  Engaged  Additional Comments:    03/07/2014,10:49 AM Rich Braveuke, Bravery Ketcham Lynn

## 2014-03-08 MED ORDER — LORAZEPAM 1 MG PO TABS
ORAL_TABLET | ORAL | Status: AC
  Filled 2014-03-08: qty 2

## 2014-03-08 MED ORDER — LORAZEPAM 1 MG PO TABS: 2.0000 mg | ORAL_TABLET | Freq: Once | ORAL | Status: AC

## 2014-03-08 MED ORDER — HALOPERIDOL 5 MG PO TABS
5.0000 mg | ORAL_TABLET | Freq: Once | ORAL | Status: AC
Start: 2014-03-08 — End: 2014-03-08
  Filled 2014-03-08: qty 1

## 2014-03-08 MED ORDER — HALOPERIDOL 5 MG PO TABS
ORAL_TABLET | ORAL | Status: AC
  Filled 2014-03-08: qty 1

## 2014-03-08 MED ORDER — LORAZEPAM 2 MG/ML IJ SOLN
2.0000 mg | Freq: Once | INTRAMUSCULAR | Status: AC
  Administered 2014-03-08: 2 mg via INTRAMUSCULAR

## 2014-03-08 MED ORDER — HALOPERIDOL LACTATE 5 MG/ML IJ SOLN
5.0000 mg | Freq: Once | INTRAMUSCULAR | Status: AC
  Administered 2014-03-08: 5 mg via INTRAMUSCULAR
  Filled 2014-03-08: qty 1

## 2014-03-08 MED ORDER — LORAZEPAM 2 MG/ML IJ SOLN
INTRAMUSCULAR | Status: AC
  Administered 2014-03-08: 2 mg via INTRAMUSCULAR
  Filled 2014-03-08: qty 1

## 2014-03-08 MED ORDER — HALOPERIDOL LACTATE 5 MG/ML IJ SOLN
INTRAMUSCULAR | Status: AC
  Administered 2014-03-08: 2 mg via INTRAMUSCULAR
  Filled 2014-03-08: qty 1

## 2014-03-08 NOTE — [facility] Group Notes (Signed)
[facility] LCSW Group Therapy  03/08/2014 3:16 PM  Type of Therapy:  Group Therapy  Participation Level:  Active  Participation Quality:  Appropriate, Sharing and Supportive  Affect:  Appropriate  Cognitive:  Appropriate  Insight:  Developing/Improving, Engaged and Supportive  Engagement in Therapy:  Developing/Improving, Engaged and Supportive  Modes of Intervention:  Discussion, Education, Exploration, Rapport Building and Support  Summary of Progress/Problems: Pt was able to participate in group appropriately. Pt was able to give examples of self-sabotaging behaviors and was able to support other discussion throughout group.   Seabron SpatesVaughn, Ellen Mayol Anne 03/08/2014, 3:16 PM

## 2014-03-08 NOTE — Progress Notes (Signed)
D: Pt denies SI/HI/AVH. Pt continues to be labile, and agitated. Pt continues to state she has not seen any doctors since she has been here. Pt is very irritable and moody. Pt has no insight to her treatment. Pt did state she feels the medication is working, her thoughts are not as -ve as before.   A: Pt was offered support and encouragement. Pt was given scheduled medications. Pt was encourage to attend groups. Q 15 minute checks were done for safety.   R:Pt attends groups and interacts well with peers and staff. Pt is taking medication .Pt  safety maintained on unit.

## 2014-03-08 NOTE — Progress Notes (Signed)
Patient ID: Angela HammockLamia S Dimitrov, female   DOB: 09/14/1980, 33 y.o.   MRN: [id number] Patient ID: Angela HammockLamia S Andrade, female   DOB: 08/30/1980, 33 y.o.   MRN: [id number] [facility]  03/08/2014 12:11 PM Angela HammockLamia S Tant  MRN:  [id number] Subjective:  Dorlis states she is feeling better.  She appears to be more stable and verbalized discharge plans.  continues to some exhibit mood instability. Noted slight improvement today.  She was calm ans refrained from angry outbursts.  She continues to ruminates about her mother having given her up.  This is a great source of hurt and rejection for her.  Patient started to also write on her journal about her past.  She states that this is a way for her to let go of anger.  She expresses how much she loves her children and she would like to go home and be with them.    Diagnosis:  Mood instability, Depression  DSM5: Depressive Disorders:  Major Depressive Disorder - Moderate (296.22) Total Time spent with patient: 30 minutes  Axis I: Bipolar, mixed  ADL's:  Intact  Sleep: Fair  Appetite:  Fair  Psychiatric Specialty Exam: Physical Exam  Review of Systems  Constitutional: Negative.   HENT: Negative.   Eyes: Negative.   Respiratory: Negative.   Cardiovascular: Negative.   Gastrointestinal: Negative.   Genitourinary: Negative.   Musculoskeletal: Negative.   Skin: Negative.   Neurological: Negative.   Endo/Heme/Allergies: Negative.   Psychiatric/Behavioral: Positive for depression. The patient is nervous/anxious.     Blood pressure 121/77, pulse 92, temperature 97.9 F (36.6 C), temperature source Oral, resp. rate 16, height 5' 3.5" (1.613 m), weight 57.607 kg (127 lb), last menstrual period 02/12/2014, not currently breastfeeding.Body mass index is 22.14 kg/(m^2).  General Appearance: Fairly Groomed  Patent attorneyye Contact::  Fair  Speech:  Pressured  Volume:  fluctuates  Mood:  Anxious, Depressed, Dysphoric and Irritable  Affect:  Labile  Thought Process:   Coherent  Orientation:  Full (Time, Place, and Person)  Thought Content:  events from the past, a sense of hoplessness helplessness  Suicidal Thoughts:  No  Homicidal Thoughts:  No  Memory:  Immediate;   Fair Recent;   Fair Remote;   Fair  Judgement:  Impaired  Insight:  Lacking  Psychomotor Activity:  Increased  Concentration:  Fair  Recall:  [employer]Fair  Fund of Knowledge:NA  Language: Fair  Akathisia:  No  Handed:    AIMS (if indicated):     Assets:  Desire for Improvement  Sleep:  Number of Hours: 5   Musculoskeletal: Strength & Muscle Tone: within normal limits Gait & Station: normal Patient leans: N/A  Current Medications: Current Facility-Administered Medications  Medication Dose Route Frequency Provider Last Rate Last Dose  . acetaminophen (TYLENOL) tablet 1,000 mg  1,000 mg Oral Q6H PRN Lind[place] QuaSheila May Kasiya Burck, NP   1,000 mg at 03/07/14 2030  . carbamazepine (TEGRETOL XR) 12 hr tablet 200 mg  200 mg Oral TID Velna HatchetSheila May Elinor Kleine, NP   200 mg at 03/08/14 [id number]  . clonazePAM (KLONOPIN) tablet 1 mg  1 mg Oral BID Velna HatchetSheila May Kamilah Correia, NP   1 mg at 03/08/14 [id number]  . haloperidol (HALDOL) tablet 2 mg  2 mg Oral Q8H PRN Beau FannyJohn C Withrow, FNP   2 mg at 03/05/14 2125   Or  . haloperidol lactate (HALDOL) injection 2 mg  2 mg Intramuscular Q8H PRN Beau FannyJohn C Withrow, FNP      . magnesium  hydroxide (MILK OF MAGNESIA) suspension 30 mL  30 mL Oral Daily PRN Shuvon Rankin, NP      . OLANZapine zydis (ZYPREXA) disintegrating tablet 10 mg  10 mg Oral QHS Lind[place] QuaSheila May Rehanna Oloughlin, NP   10 mg at 03/07/14 2242  . OLANZapine zydis (ZYPREXA) disintegrating tablet 5 mg  5 mg Oral QID PRN [place] QuaSheila May Matisha Termine, NP   5 mg at 03/06/14 1258  . traZODone (DESYREL) tablet 50 mg  50 mg Oral QHS Bernadette Armijo May Navreet Bolda, NP   50 mg at 03/07/14 2242    Lab Results:  Results for orders placed or performed during the hospital encounter of 03/04/14 (from the past 48 hour(s))  Carbamazepine level, total     Status: None   Collection  Time: 03/07/14  6:30 AM  Result Value Ref Range   Carbamazepine Lvl 9.0 4.0 - 12.0 ug/mL    Comment: [facility]    Physical Findings: AIMS: Facial and Oral Movements Muscles of Facial Expression: None, normal Lips and Perioral Area: None, normal Jaw: None, normal Tongue: None, normal,Extremity Movements Upper (arms, wrists, hands, fingers): None, normal Lower (legs, knees, ankles, toes): None, normal, Trunk Movements Neck, shoulders, hips: None, normal, Overall Severity Severity of abnormal movements (highest score from questions above): None, normal Incapacitation due to abnormal movements: None, normal Patient's awareness of abnormal movements (rate only patient's report): No Awareness, Dental Status Current problems with teeth and/or dentures?: No Does patient usually wear dentures?: No  CIWA:    COWS:     Treatment Plan Summary: Daily contact with patient to assess and evaluate symptoms and progress in treatment Medication management  Plan: Supportive approach/coping skills/relapse prevention           Optimize response to psychotropics           Increase Zyprexa Zydis to 10 mg HS           Tegretol level - 9.0,  Continue with current dose of Tegretol.  Advised patient to take 200 mg in the am and take 400 mg in the evening.  This should help her remember better regimen/frequency and avoid missed doses.  Medical Decision Making Problem Points:  Review of psycho-social stressors (1) Data Points:  Review of medication regiment & side effects (2)  I certify that inpatient services furnished can reasonably be expected to improve the patient's condition.   Adonis BrookAGUSTIN, Najah Liverman MAY, AGNP-BC 03/08/2014, 12:11 PM

## 2014-03-08 NOTE — [facility] Group Notes (Signed)
[facility] Group Notes:  (Nursing/MHT/Case Management/Adjunct)  Date:  03/08/2014  Time:  3:32 PM  Type of Therapy:  Psychoeducational Skills-Healthy Support Systems  Participation Level:  Active  Participation Quality:  Appropriate  Affect:  Appropriate  Cognitive:  Appropriate  Insight:  Appropriate  Engagement in Group:  Engaged  Modes of Intervention:  Discussion  Summary of Progress/Problems: Pt did attend healthy support systems group.   Kyleeann Cremeans Shanta 03/08/2014, 3:32 PM 

## 2014-03-08 NOTE — Progress Notes (Signed)
Patient ID: Angela HammockLamia S Hymas, female   DOB: 02/15/1981, 33 y.o.   MRN: [id number]   D: Pt has been very labile on the unit today, at times patient is flat and depressed other times she is very animated. Pt reported that something was wrong with her brain, and that she was going to tell her children to be scientist so that they could study her brain. Pt reported her depression as a 2, and her helplessness/hopelessness as a 2. Pt reported being negative SI/HI, no AH/VH noted. A: 15 min checks continued for patient safety. R: Pt safety maintained.

## 2014-03-08 NOTE — Plan of Care (Signed)
Problem: Ineffective individual coping Goal: LTG: Patient will report a decrease in negative feelings Outcome: Progressing Pt stated her -ve feelings are not as bad as when she came in

## 2014-03-08 NOTE — [facility] Group Notes (Signed)
[facility] Group Notes:  (Nursing/MHT/Case Management/Adjunct)  Date:  03/08/2014  Time:  3:09 PM  Type of Therapy:  Psychoeducational Skills-Pt self inventory group   Participation Level:  Active  Participation Quality:  Appropriate  Affect:  Appropriate  Cognitive:  Appropriate  Insight:  Appropriate  Engagement in Group:  Engaged  Modes of Intervention:  Discussion  Summary of Progress/Problems: Pt did attend self inventory group.  Erdem Naas Shanta 03/08/2014, 3:09 PM 

## 2014-03-09 ENCOUNTER — Telehealth: Payer: Self-pay | Admitting: *Deleted

## 2014-03-09 DIAGNOSIS — F39 Unspecified mood [affective] disorder: Secondary | ICD-10-CM

## 2014-03-09 MED ORDER — TRAZODONE HCL 50 MG PO TABS: 50.0000 mg | ORAL_TABLET | Freq: Every day | ORAL | Status: DC

## 2014-03-09 MED ORDER — CARBAMAZEPINE ER 200 MG PO TB12: 200.0000 mg | ORAL_TABLET | Freq: Three times a day (TID) | ORAL | Status: DC

## 2014-03-09 MED ORDER — OLANZAPINE 10 MG PO TBDP: 10.0000 mg | ORAL_TABLET | Freq: Every day | ORAL | Status: DC

## 2014-03-09 MED ORDER — HYDROXYZINE HCL 25 MG PO TABS
25.0000 mg | ORAL_TABLET | Freq: Three times a day (TID) | ORAL | Status: DC | PRN
  Filled 2014-03-09: qty 30

## 2014-03-09 MED ORDER — HYDROXYZINE HCL 25 MG PO TABS: 25.0000 mg | ORAL_TABLET | Freq: Three times a day (TID) | ORAL | Status: DC | PRN

## 2014-03-09 MED ORDER — OLANZAPINE 5 MG PO TBDP: 5.0000 mg | ORAL_TABLET | Freq: Four times a day (QID) | ORAL | Status: DC | PRN

## 2014-03-09 NOTE — Telephone Encounter (Signed)
Patient wants to schedule surgery- please advise

## 2014-03-09 NOTE — Progress Notes (Signed)
Patient ID: Angela HammockLamia S Florek, female   DOB: 03/04/1981, 33 y.o.   MRN: [id number] D)  Has been resting quietly this shift, eys closed, resp reg, unlabored, no c/o's voiced. A)  Will continue to monitor for safety, continue POC R) Safety maintained.

## 2014-03-09 NOTE — Progress Notes (Signed)
Patient ID: Angela Andrade, female   DOB: 11/24/1980, 33 y.o.   MRN: [id number] NSG D/C Note:Pt denies si/hi and A/V hallucinations at this time.States that she will comply with outpt services and will take her meds as prescribed. D/C to home.

## 2014-03-09 NOTE — [facility] Suicide Risk Assessment (Signed)
   Demographic Factors:  Low socioeconomic status  Total Time spent with patient: 45 minutes  Psychiatric Specialty Exam: Physical Exam  ROS  Blood pressure 131/77, pulse 108, temperature 98.2 F (36.8 C), temperature source Oral, resp. rate 18, height 5' 3.5" (1.613 m), weight 57.607 kg (127 lb), last menstrual period 02/12/2014, not currently breastfeeding.Body mass index is 22.14 kg/(m^2).  General Appearance: Casual  Eye Contact::  Fair  Speech:  Clear and Coherent  Volume:  Normal  Mood:  Euthymic  Affect:  Appropriate  Thought Process:  Coherent  Orientation:  Full (Time, Place, and Person)  Thought Content:  WDL  Suicidal Thoughts:  No  Homicidal Thoughts:  No  Memory:  Immediate;   Fair Recent;   Fair Remote;   Fair  Judgement:  Fair  Insight:  Fair  Psychomotor Activity:  Normal  Concentration:  Fair  Recall:  FiservFair  Fund of Knowledge:Fair  Language: Fair  Akathisia:  No    AIMS (if indicated):     Assets:  Communication Skills Desire for Improvement  Sleep:  Number of Hours: 5    Musculoskeletal: Strength & Muscle Tone: within normal limits Gait & Station: normal Patient leans: N/A   Mental Status Per Nursing Assessment::   On Admission:  Self-harm thoughts  Current Mental Status by Physician: Patient reports her mood is 'levelled' today. Denies SI/HI/AH/VH.  Loss Factors: NA  Historical Factors: Impulsivity  Risk Reduction Factors:   Positive coping skills or problem solving skills  Continued Clinical Symptoms:  Bipolar Disorder:   Mixed State  Cognitive Features That Contribute To Risk:  She is alert,oriented x3 ,has good attention and concentration.    Suicide Risk:  Minimal: No identifiable suicidal ideation.  Patients presenting with no risk factors but with morbid ruminations; may be classified as minimal risk based on the severity of the depressive symptoms  Discharge Diagnoses:   Primary Psychiatric Diagnosis: Bipolar disorder  type I ,moderate ,most recent episode with mixed features (acute phase resolved)   Non Psychiatric Diagnosis: See PMH  Past Medical History  Diagnosis Date  . Anemia   . Anxiety   . Depression   . Arthritis     Plan Of Care/Follow-up recommendations:  Activity:  no restrictions Diet:  regular  Is patient on multiple antipsychotic therapies at discharge:  No   Has Patient had three or more failed trials of antipsychotic monotherapy by history:  No  Recommended Plan for Multiple Antipsychotic Therapies: NA    Yassin Scales MD 03/09/2014, 9:47 AM

## 2014-03-09 NOTE — Progress Notes (Signed)
[facility] Group Notes:  (Nursing/MHT/Case Management/Adjunct)  Date:  03/09/2014  Time:  12:07 AM  Type of Therapy:  Psychoeducational Skills  Participation Level:  Active  Participation Quality:  Attentive  Affect:  Appropriate  Cognitive:  Lacking  Insight:  Lacking  Engagement in Group:  Limited  Modes of Intervention:  Education  Summary of Progress/Problems: The patient had very little to share in group last evening except to say that her day went "okay". As a theme for the day, her support system will consist of her ex-boyfriend. At first she mentioned that he was not very supportive of her, she then paused and acknowledged that he is presently taking care of her children and that he encouraged her to come to the hospital to get help and is not judgmental.   Rendi Mapel S 03/09/2014, 12:07 AM

## 2014-03-09 NOTE — Progress Notes (Signed)
[facility] :  Will you be returning to the same living situation after discharge: Yes,  home At discharge, do you have transportation home?:Yes,  friend Do you have the ability to pay for your medications:Yes,  Medicare  Release of information consent forms completed and submitted to medical records by CSW.  Patient to Follow up at: Follow-up Information    Follow up with [facility] On 03/11/2014.   Why:  Appt. for therapy at 7:00PM on this date with Baxter KailLawanna Brown.   Contact information:   [address] [place], [place] [address] Phone: [phone] Fax: X      Follow up with [facility] .   Why:  No answer at office/unable to schedule med management appt. Schedule med management appt with Lawanna during therapy appt on Wed 11/11.    Contact information:   [address] [place], [place] [address] Phone: [phone] Fax: X      Patient denies SI/HI:   Yes,  during group/self report.     Safety Planning and Suicide Prevention discussed:  Yes,  Pt refused to consent to family contact. SPE completed with pt and she was provided with SPI pamphlet, encouraged to share information with support network, and ask questions.  Smart, Sharrod Achille LCSWA  03/09/2014, 10:37 AM

## 2014-03-09 NOTE — Progress Notes (Signed)
Patient ID: Angela HammockLamia S Diers, female   DOB: 04/01/1981, 33 y.o.   MRN: [id number] PER STATE REGULATIONS 482.30  THIS CHART WAS REVIEWED FOR MEDICAL NECESSITY WITH RESPECT TO THE PATIENT'S ADMISSION/ DURATION OF STAY.  NEXT REVIEW DATE:  03/12/2014  Willa RoughJENNIFER JONES Landry Kamath, RN, BSN CASE MANAGER'

## 2014-03-09 NOTE — [facility] Group Notes (Signed)
[facility] Note   03/09/2014 10:13 AM  Participation Quality:  Invited-did not attend.    Smart, American FinancialHeather LCSWA

## 2014-03-10 ENCOUNTER — Telehealth: Payer: Self-pay | Admitting: *Deleted

## 2014-03-10 NOTE — Telephone Encounter (Signed)
Pt called office today for surgery details.  [name] has forwarded message regarding this issue earlier.  Please advise.

## 2014-03-11 DIAGNOSIS — F3160 Bipolar disorder, current episode mixed, unspecified: Secondary | ICD-10-CM
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
## 2014-03-13 NOTE — Progress Notes (Signed)
Patient Discharge Instructions:  After Visit Summary (AVS):   Faxed to:  03/13/14 Discharge Summary Note:   Faxed to:  03/13/14 Psychiatric Admission Assessment Note:   Faxed to:  03/13/14 Suicide Risk Assessment - Discharge Assessment:   Faxed to:  03/13/14 Faxed/Sent to the Next Level Care provider:  03/13/14  Faxed to [facility] @ [phone]  Jerelene ReddenSheena E [place], 03/13/2014, 1:19 PM

## 2014-03-17 ENCOUNTER — Telehealth: Payer: Self-pay | Admitting: *Deleted

## 2014-03-17 DIAGNOSIS — Z30011 Encounter for initial prescription of contraceptive pills: Secondary | ICD-10-CM

## 2014-03-17 NOTE — Telephone Encounter (Signed)
Patient is requesting a prescription for birth control pills. Patient states she is currently sexually active and not always using protection. Patient states her LMP is 03-09-14. [facility] [address]

## 2014-03-18 ENCOUNTER — Other Ambulatory Visit: Payer: Self-pay | Admitting: Obstetrics

## 2014-03-20 NOTE — Telephone Encounter (Signed)
Needs preop appointment

## 2014-03-20 NOTE — Telephone Encounter (Signed)
Please advise 

## 2014-03-24 NOTE — Telephone Encounter (Signed)
Please call patient to schedule preop

## 2014-03-27 NOTE — Telephone Encounter (Signed)
Dr. Clearance CootsHarper please advise what birth control ou would like this patient to have.

## 2014-04-03 MED ORDER — LEVONORGESTREL-ETHINYL ESTRAD 0.15-30 MG-MCG PO TABS: 1.0000 | ORAL_TABLET | Freq: Every day | ORAL | Status: DC

## 2014-04-03 NOTE — Telephone Encounter (Signed)
Per Dr. Clearance CootsHarper Nordette to be sent to the pharmacy. Prescription e-scribed. Attempted to contact patient and cannot accept incoming calls.

## 2014-04-27 ENCOUNTER — Encounter: Payer: Self-pay | Admitting: *Deleted

## 2014-04-28 ENCOUNTER — Encounter: Payer: Self-pay | Admitting: Obstetrics & Gynecology

## 2015-05-19 ENCOUNTER — Telehealth: Payer: Self-pay

## 2015-05-19 NOTE — Telephone Encounter (Signed)
medical release from [facility] requested medical records be mailed to them - did so as requested on 05/19/15

## 2015-06-14 ENCOUNTER — Emergency Department (HOSPITAL_COMMUNITY)
Admission: EM | Admit: 2015-06-14 | Discharge: 2015-06-15 | Disposition: A | Discharge status: Home / Self Care | Payer: Medicare Other | Attending: Emergency Medicine | Admitting: Emergency Medicine

## 2015-06-14 ENCOUNTER — Encounter (HOSPITAL_COMMUNITY): Payer: Self-pay | Admitting: *Deleted

## 2015-06-14 DIAGNOSIS — R112 Nausea with vomiting, unspecified: Secondary | ICD-10-CM | POA: Insufficient documentation

## 2015-06-14 DIAGNOSIS — G43909 Migraine, unspecified, not intractable, without status migrainosus: Secondary | ICD-10-CM | POA: Insufficient documentation

## 2015-06-14 NOTE — ED Notes (Signed)
Pt states that she has had a migraine since this morning. Pt states that she has not been able to eat as well. Pt states that she has N/V. Pt reports hx of migraines. Pt reports pain is worse with light and sound.

## 2015-06-14 NOTE — ED Notes (Signed)
Pt has been called for a room in [facility] no answer X3

## 2015-06-14 NOTE — ED Notes (Signed)
Called x 2. No answer 

## 2016-03-12 ENCOUNTER — Encounter (HOSPITAL_COMMUNITY): Payer: Self-pay | Admitting: *Deleted

## 2016-03-12 ENCOUNTER — Emergency Department (HOSPITAL_COMMUNITY)
Admission: EM | Admit: 2016-03-12 | Discharge: 2016-03-12 | Disposition: A | Discharge status: Home / Self Care | Payer: Medicare Other | Attending: Emergency Medicine | Admitting: Emergency Medicine

## 2016-03-12 ENCOUNTER — Emergency Department (HOSPITAL_COMMUNITY): Payer: Medicare Other

## 2016-03-12 DIAGNOSIS — S76011A Strain of muscle, fascia and tendon of right hip, initial encounter: Secondary | ICD-10-CM | POA: Diagnosis not present

## 2016-03-12 DIAGNOSIS — X58XXXA Exposure to other specified factors, initial encounter: Secondary | ICD-10-CM | POA: Insufficient documentation

## 2016-03-12 DIAGNOSIS — Z87891 Personal history of nicotine dependence: Secondary | ICD-10-CM | POA: Diagnosis not present

## 2016-03-12 DIAGNOSIS — M545 Low back pain: Secondary | ICD-10-CM | POA: Diagnosis not present

## 2016-03-12 DIAGNOSIS — Y929 Unspecified place or not applicable: Secondary | ICD-10-CM | POA: Insufficient documentation

## 2016-03-12 DIAGNOSIS — Y999 Unspecified external cause status: Secondary | ICD-10-CM | POA: Diagnosis not present

## 2016-03-12 DIAGNOSIS — G8929 Other chronic pain: Secondary | ICD-10-CM | POA: Insufficient documentation

## 2016-03-12 DIAGNOSIS — S79911A Unspecified injury of right hip, initial encounter: Secondary | ICD-10-CM | POA: Diagnosis present

## 2016-03-12 DIAGNOSIS — Y939 Activity, unspecified: Secondary | ICD-10-CM | POA: Diagnosis not present

## 2016-03-12 LAB — CBC WITH DIFFERENTIAL/PLATELET
Basophils Absolute: 0 10*3/uL (ref 0.0–0.1)
Basophils Relative: 0 %
Eosinophils Absolute: 0 10*3/uL (ref 0.0–0.7)
Eosinophils Relative: 1 %
HEMATOCRIT: 32.7 % — AB (ref 36.0–46.0)
HEMOGLOBIN: 11.1 g/dL — AB (ref 12.0–15.0)
Lymphocytes Relative: 36 %
Lymphs Abs: 2.5 10*3/uL (ref 0.7–4.0)
MCH: 25.6 pg — AB (ref 26.0–34.0)
MCHC: 33.9 g/dL (ref 30.0–36.0)
MCV: 75.3 fL — ABNORMAL LOW (ref 78.0–100.0)
Monocytes Absolute: 0.5 10*3/uL (ref 0.1–1.0)
Monocytes Relative: 7 %
Neutro Abs: 4.1 10*3/uL (ref 1.7–7.7)
Neutrophils Relative %: 56 %
Platelets: 208 10*3/uL (ref 150–400)
RBC: 4.34 MIL/uL (ref 3.87–5.11)
RDW: 13.9 % (ref 11.5–15.5)
WBC: 7.1 10*3/uL (ref 4.0–10.5)

## 2016-03-12 LAB — BASIC METABOLIC PANEL
Anion gap: 8 (ref 5–15)
BUN: 13 mg/dL (ref 6–20)
CO2: 21 mmol/L — AB (ref 22–32)
Calcium: 9.1 mg/dL (ref 8.9–10.3)
Chloride: 107 mmol/L (ref 101–111)
Creatinine, Ser: 0.76 mg/dL (ref 0.44–1.00)
GFR calc Af Amer: >60 mL/min (ref >60)
GFR calc non Af Amer: >60 mL/min (ref >60)
Glucose, Bld: 118 mg/dL — ABNORMAL HIGH (ref 65–99)
POTASSIUM: 3.7 mmol/L (ref 3.5–5.1)
Sodium: 136 mmol/L (ref 135–145)

## 2016-03-12 LAB — I-STAT BETA HCG BLOOD, ED (MC, WL, AP ONLY)

## 2016-03-12 MED ORDER — SODIUM CHLORIDE 0.9 % IV BOLUS (SEPSIS)
1000.0000 mL | Freq: Once | INTRAVENOUS | Status: AC
  Administered 2016-03-12: 1000 mL via INTRAVENOUS

## 2016-03-12 MED ORDER — OXYCODONE-ACETAMINOPHEN 5-325 MG PO TABS: 1.0000 | ORAL_TABLET | Freq: Four times a day (QID) | ORAL | 0 refills | Status: DC | PRN

## 2016-03-12 MED ORDER — HYDROMORPHONE HCL 2 MG/ML IJ SOLN
1.0000 mg | Freq: Once | INTRAMUSCULAR | Status: AC
  Administered 2016-03-12: 1 mg via INTRAVENOUS
  Filled 2016-03-12: qty 1

## 2016-03-12 MED ORDER — DIPHENHYDRAMINE HCL 50 MG/ML IJ SOLN
25.0000 mg | Freq: Once | INTRAMUSCULAR | Status: AC
  Administered 2016-03-12: 25 mg via INTRAVENOUS
  Filled 2016-03-12: qty 1

## 2016-03-12 MED ORDER — DIAZEPAM 5 MG/ML IJ SOLN
5.0000 mg | Freq: Once | INTRAMUSCULAR | Status: AC
  Administered 2016-03-12: 5 mg via INTRAVENOUS
  Filled 2016-03-12: qty 2

## 2016-03-12 MED ORDER — CYCLOBENZAPRINE HCL 5 MG PO TABS: 5.0000 mg | ORAL_TABLET | Freq: Three times a day (TID) | ORAL | 0 refills | Status: DC | PRN

## 2016-03-12 NOTE — Discharge Instructions (Signed)
Take motrin for pain.   Take flexeril for muscle spasms.   Take motrin for pain.  See your doctor.   Return to ER if you have severe pain, unable to walk, weakness, numbness.

## 2016-03-12 NOTE — ED Notes (Signed)
Patient transported to X-ray 

## 2016-03-12 NOTE — ED Triage Notes (Signed)
Pt started having a headache yesterday with L sided neck pain. Pt has been taking 800mg  ibuprofen since headache started. Pt reports today having R sided low back pain with burning sensation down bilateral legs and difficulty walking. Pt is ambulatory, although limbs while walking
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
## 2017-01-15 ENCOUNTER — Inpatient Hospital Stay (HOSPITAL_COMMUNITY)
Admission: AD | Admit: 2017-01-15 | Discharge: 2017-01-15 | Disposition: A | Discharge status: Home / Self Care | Payer: Medicare Other | Source: Ambulatory Visit | Attending: Family Medicine | Admitting: Family Medicine

## 2017-01-15 ENCOUNTER — Encounter: Payer: Self-pay | Admitting: Family

## 2017-01-15 DIAGNOSIS — Z3201 Encounter for pregnancy test, result positive: Secondary | ICD-10-CM | POA: Insufficient documentation

## 2017-01-15 DIAGNOSIS — O219 Vomiting of pregnancy, unspecified: Secondary | ICD-10-CM

## 2017-01-15 DIAGNOSIS — Z3202 Encounter for pregnancy test, result negative: Secondary | ICD-10-CM | POA: Diagnosis not present

## 2017-01-15 DIAGNOSIS — O0990 Supervision of high risk pregnancy, unspecified, unspecified trimester: Secondary | ICD-10-CM

## 2017-01-15 DIAGNOSIS — R110 Nausea: Secondary | ICD-10-CM | POA: Insufficient documentation

## 2017-01-15 DIAGNOSIS — O3680X0 Pregnancy with inconclusive fetal viability, not applicable or unspecified: Secondary | ICD-10-CM

## 2017-01-15 DIAGNOSIS — O210 Mild hyperemesis gravidarum: Secondary | ICD-10-CM | POA: Diagnosis present

## 2017-01-15 LAB — URINALYSIS, ROUTINE W REFLEX MICROSCOPIC
BACTERIA UA: NONE SEEN
Bilirubin Urine: NEGATIVE
Glucose, UA: NEGATIVE mg/dL
Ketones, ur: NEGATIVE mg/dL
Nitrite: NEGATIVE
PROTEIN: NEGATIVE mg/dL
SPECIFIC GRAVITY, URINE: 1.019 (ref 1.005–1.030)
pH: 5 (ref 5.0–8.0)

## 2017-01-15 LAB — POCT PREGNANCY, URINE: Preg Test, Ur: POSITIVE — AB

## 2017-01-15 LAB — HCG, QUANTITATIVE, PREGNANCY: HCG, BETA CHAIN, QUANT, S: 2249 m[IU]/mL — AB (ref <5)

## 2017-01-15 NOTE — MAU Note (Signed)
Pt. Here due to being nauseous since August, unable to say how many times she has actually vomitted. Pt. took three pregnancy test this morning, positive x3.

## 2017-01-15 NOTE — MAU Provider Note (Signed)
History   [id number]   Chief Complaint  Patient presents with  . Nausea  . Possible Pregnancy    HPI Angela Andrade is a 36 y.o. female  [phone] here with report of increased nausea  that began in August.  Reports not vomiting in the past 24 hours.  Denies fever, body aches, or chills.  Exposed to daughter who was sick with a viral illness and felt nausea was related to that.  Denies vaginal bleeding or pelvic pain.    Patient's last menstrual period was 12/22/2016.  OB History  Gravida Para Term Preterm AB Living  SAB TAB Ectopic Multiple Live Births          3    # Outcome Date GA Lbr Len/2nd Weight Sex Delivery Anes PTL Lv  3 Term 03/21/12 [redacted]w[redacted]d 05:45 / 00:02  F Vag-Spont None  LIV     Birth Comments: bilateral exta digit  2 Term 2010 [redacted]w[redacted]d 08:00 8 lb 2 oz (3.685 kg) M Vag-Spont EPI  LIV  1 Term 2001 [redacted]w[redacted]d 24:00 5 lb 9 oz (2.523 kg) M Vag-Spont EPI  LIV      Past Medical History:  Diagnosis Date  . Anemia   . Anxiety   . Arthritis   . Depression     Family History  Problem Relation Age of Onset  . Diabetes Maternal Uncle   . Hypertension Maternal Uncle   . Cancer Mother   . Hypertension Mother   . Diabetes Maternal Aunt   . Hypertension Maternal Aunt   . Cancer Maternal Grandmother   . Diabetes Maternal Grandmother   . Hypertension Maternal Grandmother     Social History   Social History  . Marital status: Single    Spouse name: N/A  . Number of children: N/A  . Years of education: N/A   Social History Main Topics  . Smoking status: Former Smoker    Packs/day: 0.25    Types: Cigarettes    Quit date: 07/06/2011  . Smokeless tobacco: Never Used  . Alcohol use No  . Drug use: No  . Sexual activity: Yes    Birth control/ protection: None     Comment: Last month was last encounter   Other Topics Concern  . Not on file   Social History Narrative  . No narrative on file    Allergies  Allergen Reactions  . Hydrocodone Itching    Ok  to take with benadryl  . Morphine And Related Itching    Ok to take with benadryl  . Zyprexa [Olanzapine] Other (See Comments)    hallucinations    No current facility-administered medications on file prior to encounter.    Current Outpatient Prescriptions on File Prior to Encounter  Medication Sig Dispense Refill  . carbamazepine (TEGRETOL XR) 200 MG 12 hr tablet Take 1 tablet (200 mg total) by mouth 3 (three) times daily. 90 tablet 0  . cyclobenzaprine (FLEXERIL) 5 MG tablet Take 1 tablet (5 mg total) by mouth 3 (three) times daily as needed for muscle spasms. 10 tablet 0  . hydrOXYzine (ATARAX/VISTARIL) 25 MG tablet Take 1 tablet (25 mg total) by mouth 3 (three) times daily as needed for anxiety. 30 tablet 0  . ibuprofen (ADVIL,MOTRIN) 800 MG tablet Take 1 tablet (800 mg total) by mouth every 8 (eight) hours as needed. 30 tablet 5  . levonorgestrel-ethinyl estradiol (NORDETTE, 28,) 0.15-30 MG-MCG tablet Take 1 tablet by mouth  daily. 1 Package 11  . medroxyPROGESTERone (PROVERA) 10 MG tablet Take 2 tablets (20 mg total) by mouth daily. 20 tablet 0  . OLANZapine zydis (ZYPREXA) 10 MG disintegrating tablet Take 1 tablet (10 mg total) by mouth at bedtime. 30 tablet 0  . OLANZapine zydis (ZYPREXA) 5 MG disintegrating tablet Take 1 tablet (5 mg total) by mouth 4 (four) times daily as needed (mood stabilization, agitation). 30 tablet 0  . oxyCODONE-acetaminophen (PERCOCET) 5-325 MG tablet Take 1 tablet by mouth every 6 (six) hours as needed. 10 tablet 0  . traZODone (DESYREL) 50 MG tablet Take 1 tablet (50 mg total) by mouth at bedtime. For insomnia 30 tablet 0     Review of Systems  Constitutional: Negative for chills, fatigue and fever.  Respiratory: Negative for cough.   Gastrointestinal: Positive for nausea. Negative for abdominal pain and vomiting.  Genitourinary: Negative for dysuria, pelvic pain, vaginal bleeding and vaginal discharge.     Physical Exam   Vitals:   01/15/17 0731   BP: 123/82  Pulse: 77  Resp: 18  Temp: 98.6 F (37 C)  Weight: 130 lb (59 kg)  Height:  (1.6 m)    Physical Exam  Constitutional: She is oriented to person, place, and time. She appears well-developed and well-nourished. No distress.  HENT:  Head: Normocephalic.  Neck: Normal range of motion. Neck supple.  Cardiovascular: Normal rate, regular rhythm and normal heart sounds.   Respiratory: Effort normal and breath sounds normal. No respiratory distress.  GI: Soft. She exhibits no mass. There is no tenderness. There is no rebound and no guarding.  Genitourinary: Uterus is not enlarged. Right adnexum displays no mass, no tenderness and no fullness. Left adnexum displays no mass, no tenderness and no fullness. No bleeding in the vagina.  Musculoskeletal: Normal range of motion.  Neurological: She is alert and oriented to person, place, and time.  Skin: Skin is warm and dry.    MAU Course  Procedures  MDM BHCG pending  Report given to Raelyn Mora who assumes care of patient.  Marlis Edelson, CNM 01/15/2017 7:40 AM   Assessment and Plan

## 2017-01-15 NOTE — Discharge Instructions (Signed)
[facility]  ° ° °[facility]       Phone: [phone] ° °[facility] Phone: [phone] ° °[facility] [place]  Phone: [phone] ° °[facility] [place]  Phone: [phone] ° °[facility]  Phone: [phone] ° °[facility]       Phone: [phone] ° °[facility]    Phone: [phone]  ° °[facility]    Phone: [phone] ° °[facility]    Phone: [phone] ° °[facility]    Phone: [phone] ° °[facility]    Phone: [phone] ° °[facility]       Phone: [phone]  ° °[facility]  Phone: [phone] ° °[facility]    Phone: [phone] ° °[facility]   Phone: [phone] ° °[facility]      Phone: [phone] ° °[facility]    Phone: [phone] ° °

## 2017-01-31 ENCOUNTER — Other Ambulatory Visit: Payer: Self-pay | Admitting: Obstetrics and Gynecology

## 2017-01-31 ENCOUNTER — Ambulatory Visit (HOSPITAL_COMMUNITY): Admission: RE | Admit: 2017-01-31 | Payer: Medicare Other | Source: Ambulatory Visit

## 2017-01-31 ENCOUNTER — Ambulatory Visit: Payer: Medicare Other | Admitting: *Deleted

## 2017-01-31 ENCOUNTER — Ambulatory Visit (HOSPITAL_COMMUNITY)
Admission: RE | Admit: 2017-01-31 | Discharge: 2017-01-31 | Disposition: A | Discharge status: Home / Self Care | Payer: Medicare Other | Source: Ambulatory Visit | Attending: Obstetrics and Gynecology | Admitting: Obstetrics and Gynecology

## 2017-01-31 ENCOUNTER — Encounter: Payer: Self-pay | Admitting: Family Medicine

## 2017-01-31 ENCOUNTER — Telehealth: Payer: Self-pay | Admitting: Family Medicine

## 2017-01-31 DIAGNOSIS — Z3490 Encounter for supervision of normal pregnancy, unspecified, unspecified trimester: Secondary | ICD-10-CM

## 2017-01-31 DIAGNOSIS — Z3A01 Less than 8 weeks gestation of pregnancy: Secondary | ICD-10-CM | POA: Insufficient documentation

## 2017-01-31 DIAGNOSIS — N83202 Unspecified ovarian cyst, left side: Secondary | ICD-10-CM | POA: Insufficient documentation

## 2017-01-31 DIAGNOSIS — O3680X0 Pregnancy with inconclusive fetal viability, not applicable or unspecified: Secondary | ICD-10-CM

## 2017-01-31 DIAGNOSIS — O26891 Other specified pregnancy related conditions, first trimester: Secondary | ICD-10-CM | POA: Insufficient documentation

## 2017-01-31 DIAGNOSIS — O3481 Maternal care for other abnormalities of pelvic organs, first trimester: Secondary | ICD-10-CM | POA: Insufficient documentation

## 2017-01-31 NOTE — Progress Notes (Signed)
Here for results of ultrasound.  Reviewed with Dorathy Kinsman, CNM and instructed patient US shows live baby 7wk 1d with small left ovarian cyst. Recommend she start prenatal care with provider of her choice and that provider will followup on cyst. She voices understanding.

## 2017-01-31 NOTE — Telephone Encounter (Signed)
When I was checking the patient out I printed her Proof of Pregnancy she said she had her own Dr office that she will be starting her care.    Angela Andrade

## 2017-02-05 NOTE — Progress Notes (Signed)
I reviewed the [place] results. Pt instructed to Start [facility].   Katrinka Blazing, [place], CNM 02/05/2017 2:16 AM

## 2017-02-07 ENCOUNTER — Telehealth: Payer: Self-pay | Admitting: General Practice

## 2017-02-07 NOTE — Telephone Encounter (Signed)
Patient called into front office reporting concern about ovarian cyst and no one told her about this and she doesn't have an appt until 11/8. Discussed results with patient and that cyst in pregnant and non pregnant women are normal. Discussed that cysts usually don't require treatment and go away on their own over time. Reassured patient that it doesn't pose a risk to the baby. Discussed with patient that we usually see pregnant patients around 12 weeks as there isn't much to do care wise during first trimester. Discussed next ultrasound would be first trimester screen if she elects or anatomy. Patient verbalized understanding and asked what she could take for pain. Told patient she may take extra strength tylenol, just no more than  in 24 hours. Patient verbalized understanding & had no questions

## 2017-02-17 ENCOUNTER — Inpatient Hospital Stay (HOSPITAL_COMMUNITY)
Admission: AD | Admit: 2017-02-17 | Discharge: 2017-02-17 | Disposition: A | Discharge status: Home / Self Care | Payer: Medicare Other | Source: Ambulatory Visit | Attending: Obstetrics and Gynecology | Admitting: Obstetrics and Gynecology

## 2017-02-17 ENCOUNTER — Encounter (HOSPITAL_COMMUNITY): Payer: Self-pay | Admitting: *Deleted

## 2017-02-17 DIAGNOSIS — Z3A00 Weeks of gestation of pregnancy not specified: Secondary | ICD-10-CM | POA: Insufficient documentation

## 2017-02-17 DIAGNOSIS — N611 Abscess of the breast and nipple: Secondary | ICD-10-CM | POA: Insufficient documentation

## 2017-02-17 DIAGNOSIS — O210 Mild hyperemesis gravidarum: Secondary | ICD-10-CM | POA: Diagnosis not present

## 2017-02-17 DIAGNOSIS — Z113 Encounter for screening for infections with a predominantly sexual mode of transmission: Secondary | ICD-10-CM

## 2017-02-17 DIAGNOSIS — Z87891 Personal history of nicotine dependence: Secondary | ICD-10-CM | POA: Insufficient documentation

## 2017-02-17 DIAGNOSIS — O9989 Other specified diseases and conditions complicating pregnancy, childbirth and the puerperium: Secondary | ICD-10-CM

## 2017-02-17 DIAGNOSIS — N649 Disorder of breast, unspecified: Secondary | ICD-10-CM | POA: Diagnosis present

## 2017-02-17 DIAGNOSIS — O3680X0 Pregnancy with inconclusive fetal viability, not applicable or unspecified: Secondary | ICD-10-CM

## 2017-02-17 DIAGNOSIS — O26891 Other specified pregnancy related conditions, first trimester: Secondary | ICD-10-CM | POA: Diagnosis not present

## 2017-02-17 DIAGNOSIS — Z885 Allergy status to narcotic agent status: Secondary | ICD-10-CM | POA: Insufficient documentation

## 2017-02-17 LAB — WET PREP, GENITAL
CLUE CELLS WET PREP: NONE SEEN
Sperm: NONE SEEN
TRICH WET PREP: NONE SEEN
Yeast Wet Prep HPF POC: NONE SEEN

## 2017-02-17 LAB — URINALYSIS, ROUTINE W REFLEX MICROSCOPIC
Bacteria, UA: NONE SEEN
Bilirubin Urine: NEGATIVE
GLUCOSE, UA: NEGATIVE mg/dL
Ketones, ur: NEGATIVE mg/dL
Nitrite: NEGATIVE
PH: 6 (ref 5.0–8.0)
Protein, ur: NEGATIVE mg/dL
SPECIFIC GRAVITY, URINE: 1.014 (ref 1.005–1.030)
WBC, UA: NONE SEEN WBC/hpf (ref 0–5)

## 2017-02-17 MED ORDER — PROMETHAZINE HCL 12.5 MG PO TABS: 12.5000 mg | ORAL_TABLET | Freq: Four times a day (QID) | ORAL | 3 refills | Status: DC | PRN

## 2017-02-17 NOTE — MAU Note (Signed)
Patient has small pea sized lump on left areola that she first noticed yesterday and states it hurts to touch it.  Also states she has n/v and has not been able to get medications because of her insurance.  States her vaginal discharge is "normal, but I would like STD testing."

## 2017-02-17 NOTE — Discharge Instructions (Signed)

## 2017-02-17 NOTE — MAU Provider Note (Signed)
History   G4P3003 in with c/o red raised bump left breast, tender to touch. Started yesterday. Also desires STD testing. Denies abd pain.   CSN: [id number]  Arrival date & time 02/17/17  1016   None     No chief complaint on file.   HPI  Past Medical History:  Diagnosis Date  . Anemia   . Anxiety   . Arthritis   . Depression     Past Surgical History:  Procedure Laterality Date  . brain injury    . coma    . HIP FRACTURE SURGERY    . PELVIC FRACTURE SURGERY    . spleen repair      Family History  Problem Relation Age of Onset  . Diabetes Maternal Uncle   . Hypertension Maternal Uncle   . Cancer Mother   . Hypertension Mother   . Diabetes Maternal Aunt   . Hypertension Maternal Aunt   . Cancer Maternal Grandmother   . Diabetes Maternal Grandmother   . Hypertension Maternal Grandmother     Social History  Substance Use Topics  . Smoking status: Former Smoker    Packs/day: 0.25    Types: Cigarettes    Quit date: 07/06/2011  . Smokeless tobacco: Never Used  . Alcohol use No    OB History    Gravida Para Term Preterm AB Living   4 3 3     3    SAB TAB Ectopic Multiple Live Births           3      Review of Systems  Constitutional: Negative.   HENT: Negative.   Eyes: Negative.   Respiratory: Negative.   Cardiovascular: Negative.   Gastrointestinal: Negative.   Endocrine: Negative.   Genitourinary: Negative.   Musculoskeletal: Negative.   Skin:       Pea sized red raised lesion noted. No redness or warmed to touch.  Neurological: Negative.   Hematological: Negative.   Psychiatric/Behavioral: Negative.     Allergies  Hydrocodone; Morphine and related; and Zyprexa [olanzapine]  Home Medications    BP 115/65 (BP Location: Right Arm)   Pulse 65   Temp 98.3 F (36.8 C) (Oral)   Resp 18   LMP 12/22/2016 Comment: negative preg test tonight  SpO2 100%   Physical Exam  Constitutional: She is oriented to person, place, and time. She appears  well-developed and well-nourished.  HENT:  Head: Normocephalic.  Eyes: Pupils are equal, round, and reactive to light.  Neck: Normal range of motion.  Cardiovascular: Normal rate, regular rhythm, normal heart sounds and intact distal pulses.   Pulmonary/Chest: Effort normal and breath sounds normal.  Abdominal: Soft. Bowel sounds are normal.  Genitourinary: Vagina normal and uterus normal.  Musculoskeletal: Normal range of motion.  Neurological: She is alert and oriented to person, place, and time. She has normal reflexes.  Skin: Skin is warm and dry.  Pea sized red raised bump at 11:00left breast  Psychiatric: She has a normal mood and affect. Her behavior is normal. Judgment and thought content normal.    MAU Course  Procedures (including critical care time)  Labs Reviewed  URINALYSIS, ROUTINE W REFLEX MICROSCOPIC - Abnormal; Notable for the following:       Result Value   APPearance CLOUDY (*)    Hgb urine dipstick MODERATE (*)    Leukocytes, UA TRACE (*)    Squamous Epithelial / LPF 0-5 (*)    All other components within normal limits  WET PREP, GENITAL  HIV ANTIBODY (ROUTINE TESTING)  RPR  GC/CHLAMYDIA PROBE AMP ([place]) NOT AT [facility]   No results found.   1. Carbuncle of breast   2. Pregnancy of unknown anatomic location   3. Morning sickness   4. Screen for STD (sexually transmitted disease)       MDM  Wet prep neg,  and cultures obtained. Warn compresses to left breast TID. Will d/c home to f/u PRN

## 2017-02-18 LAB — HIV ANTIBODY (ROUTINE TESTING W REFLEX): HIV SCREEN 4TH GENERATION: NONREACTIVE

## 2017-02-18 LAB — RPR: RPR Ser Ql: NONREACTIVE

## 2017-02-19 LAB — GC/CHLAMYDIA PROBE AMP (~~LOC~~) NOT AT ARMC
Chlamydia: NEGATIVE
Neisseria Gonorrhea: NEGATIVE

## 2017-03-08 ENCOUNTER — Encounter: Payer: Self-pay | Admitting: Obstetrics and Gynecology

## 2017-03-08 ENCOUNTER — Other Ambulatory Visit (HOSPITAL_COMMUNITY)
Admission: RE | Admit: 2017-03-08 | Discharge: 2017-03-08 | Disposition: A | Discharge status: Home / Self Care | Payer: Medicare Other | Source: Ambulatory Visit | Attending: Obstetrics and Gynecology | Admitting: Obstetrics and Gynecology

## 2017-03-08 ENCOUNTER — Ambulatory Visit (INDEPENDENT_AMBULATORY_CARE_PROVIDER_SITE_OTHER): Payer: Medicare Other | Admitting: Obstetrics and Gynecology

## 2017-03-08 VITALS — BP 111/67 | HR 89 | Wt 129.8 lb

## 2017-03-08 DIAGNOSIS — F3160 Bipolar disorder, current episode mixed, unspecified: Secondary | ICD-10-CM

## 2017-03-08 DIAGNOSIS — O210 Mild hyperemesis gravidarum: Secondary | ICD-10-CM | POA: Diagnosis not present

## 2017-03-08 DIAGNOSIS — Z331 Pregnant state, incidental: Secondary | ICD-10-CM

## 2017-03-08 DIAGNOSIS — Z3492 Encounter for supervision of normal pregnancy, unspecified, second trimester: Secondary | ICD-10-CM

## 2017-03-08 DIAGNOSIS — Z124 Encounter for screening for malignant neoplasm of cervix: Secondary | ICD-10-CM

## 2017-03-08 DIAGNOSIS — Z3490 Encounter for supervision of normal pregnancy, unspecified, unspecified trimester: Secondary | ICD-10-CM | POA: Diagnosis present

## 2017-03-08 MED ORDER — ONDANSETRON 4 MG PO TBDP: 4.0000 mg | ORAL_TABLET | Freq: Four times a day (QID) | ORAL | 1 refills | Status: DC | PRN

## 2017-03-08 MED ORDER — PREPLUS 27-1 MG PO TABS: 1.0000 | ORAL_TABLET | Freq: Every day | ORAL | 13 refills | Status: DC

## 2017-03-08 NOTE — Progress Notes (Signed)
Subjective:  Angela Andrade is a 36 y.o. [phone] at [redacted]w[redacted]d being seen today for her first OB appt. H/O Bipolar/Depression. Prior medication. Stop meds with pregnancy. [facility].   TSVD x 3 without problems.  She is currently monitored for the following issues for this high-risk pregnancy and has Mood disorder (HCC); Bipolar I disorder, most recent episode mixed (HCC); and Morning sickness on their problem list.  Patient reports nausea.  Contractions: Not present. Vag. Bleeding: None.   . Denies leaking of fluid.   The following portions of the patient's history were reviewed and updated as appropriate: allergies, current medications, past family history, past medical history, past social history, past surgical history and problem list. Problem list updated.  Objective:   Vitals:   03/08/17 1016  BP: 111/67  Pulse: 89  Weight: 58.9 kg (129 lb 12.8 oz)    Fetal Status: Fetal Heart Rate (bpm): 175         General:  Alert, oriented and cooperative. Patient is in no acute distress.  Skin: Skin is warm and dry. No rash noted.   Cardiovascular: Normal heart rate noted  Respiratory: Normal respiratory effort, no problems with respiration noted  Abdomen: Soft, gravid, appropriate for gestational age. Pain/Pressure: Present     Pelvic:  Cervical exam performed        Extremities: Normal range of motion.  Edema: None  Mental Status: Normal mood and affect. Normal behavior. Normal judgment and thought content.   Urinalysis:      Assessment and Plan:  Pregnancy: G4P3003 at [redacted]w[redacted]d  1. Encounter for supervision of low-risk pregnancy, antepartum Prenatal care and labs reviewed with pt.  - Hemoglobinopathy evaluation - US MFM Fetal Nuchal Translucency; Future - Cytology - PAP - Obstetric Panel, Including HIV - Culture, OB Urine - Enroll Patient in Babyscripts  2. Morning sickness Zofran  3. Bipolar I disorder, most recent episode mixed (HCC) Offered  our counselor services to pt.   4. AMA Reviewed with pt. NT ordered  Preterm labor symptoms and general obstetric precautions including but not limited to vaginal bleeding, contractions, leaking of fluid and fetal movement were reviewed in detail with the patient. Please refer to After Visit Summary for other counseling recommendations.  Return in about 4 weeks (around 04/05/2017) for OB visit.   Hermina StaggersErvin, Brittney Caraway L, MD

## 2017-03-08 NOTE — Patient Instructions (Signed)
First Trimester of Pregnancy The first trimester of pregnancy is from week 1 until the end of week 13 (months 1 through 3). A week after a sperm fertilizes an egg, the egg will implant on the wall of the uterus. This embryo will begin to develop into a baby. Genes from you and your partner will form the baby. The female genes will determine whether the baby will be a boy or a girl. At 6-8 weeks, the eyes and face will be formed, and the heartbeat can be seen on ultrasound. At the end of 12 weeks, all the baby's organs will be formed. Now that you are pregnant, you will want to do everything you can to have a healthy baby. Two of the most important things are to get good prenatal care and to follow your health care provider's instructions. Prenatal care is all the medical care you receive before the baby's birth. This care will help prevent, find, and treat any problems during the pregnancy and childbirth. Body changes during your first trimester Your body goes through many changes during pregnancy. The changes vary from woman to woman.  You may gain or lose a couple of pounds at first.  You may feel sick to your stomach (nauseous) and you may throw up (vomit). If the vomiting is uncontrollable, call your health care provider.  You may tire easily.  You may develop headaches that can be relieved by medicines. All medicines should be approved by your health care provider.  You may urinate more often. Painful urination may mean you have a bladder infection.  You may develop heartburn as a result of your pregnancy.  You may develop constipation because certain hormones are causing the muscles that push stool through your intestines to slow down.  You may develop hemorrhoids or swollen veins (varicose veins).  Your breasts may begin to grow larger and become tender. Your nipples may stick out more, and the tissue that surrounds them (areola) may become darker.  Your gums may bleed and may be  sensitive to brushing and flossing.  Dark spots or blotches (chloasma, mask of pregnancy) may develop on your face. This will likely fade after the baby is born.  Your menstrual periods will stop.  You may have a loss of appetite.  You may develop cravings for certain kinds of food.  You may have changes in your emotions from day to day, such as being excited to be pregnant or being concerned that something may go wrong with the pregnancy and baby.  You may have more vivid and strange dreams.  You may have changes in your hair. These can include thickening of your hair, rapid growth, and changes in texture. Some women also have hair loss during or after pregnancy, or hair that feels dry or thin. Your hair will most likely return to normal after your baby is born.  What to expect at prenatal visits During a routine prenatal visit:  You will be weighed to make sure you and the baby are growing normally.  Your blood pressure will be taken.  Your abdomen will be measured to track your baby's growth.  The fetal heartbeat will be listened to between weeks 10 and 14 of your pregnancy.  Test results from any previous visits will be discussed.  Your health care provider may ask you:  How you are feeling.  If you are feeling the baby move.  If you have had any abnormal symptoms, such as leaking fluid, bleeding, severe headaches,   or abdominal cramping.  If you are using any tobacco products, including cigarettes, chewing tobacco, and electronic cigarettes.  If you have any questions.  Other tests that may be performed during your first trimester include:  Blood tests to find your blood type and to check for the presence of any previous infections. The tests will also be used to check for low iron levels (anemia) and protein on red blood cells (Rh antibodies). Depending on your risk factors, or if you previously had diabetes during pregnancy, you may have tests to check for high blood  sugar that affects pregnant women (gestational diabetes).  Urine tests to check for infections, diabetes, or protein in the urine.  An ultrasound to confirm the proper growth and development of the baby.  Fetal screens for spinal cord problems (spina bifida) and Down syndrome.  HIV (human immunodeficiency virus) testing. Routine prenatal testing includes screening for HIV, unless you choose not to have this test.  You may need other tests to make sure you and the baby are doing well.  Follow these instructions at home: Medicines  Follow your health care provider's instructions regarding medicine use. Specific medicines may be either safe or unsafe to take during pregnancy.  Take a prenatal vitamin that contains at least 600 micrograms (mcg) of folic acid.  If you develop constipation, try taking a stool softener if your health care provider approves. Eating and drinking  Eat a balanced diet that includes fresh fruits and vegetables, whole grains, good sources of protein such as meat, eggs, or tofu, and low-fat dairy. Your health care provider will help you determine the amount of weight gain that is right for you.  Avoid raw meat and uncooked cheese. These carry germs that can cause birth defects in the baby.  Eating four or five small meals rather than three large meals a day may help relieve nausea and vomiting. If you start to feel nauseous, eating a few soda crackers can be helpful. Drinking liquids between meals, instead of during meals, also seems to help ease nausea and vomiting.  Limit foods that are high in fat and processed sugars, such as fried and sweet foods.  To prevent constipation: ? Eat foods that are high in fiber, such as fresh fruits and vegetables, whole grains, and beans. ? Drink enough fluid to keep your urine clear or pale yellow. Activity  Exercise only as directed by your health care provider. Most women can continue their usual exercise routine during  pregnancy. Try to exercise for 30 minutes at least 5 days a week. Exercising will help you: ? Control your weight. ? Stay in shape. ? Be prepared for labor and delivery.  Experiencing pain or cramping in the lower abdomen or lower back is a good sign that you should stop exercising. Check with your health care provider before continuing with normal exercises.  Try to avoid standing for long periods of time. Move your legs often if you must stand in one place for a long time.  Avoid heavy lifting.  Wear low-heeled shoes and practice good posture.  You may continue to have sex unless your health care provider tells you not to. Relieving pain and discomfort  Wear a good support bra to relieve breast tenderness.  Take warm sitz baths to soothe any pain or discomfort caused by hemorrhoids. Use hemorrhoid cream if your health care provider approves.  Rest with your legs elevated if you have leg cramps or low back pain.  If you develop   varicose veins in your legs, wear support hose. Elevate your feet for 15 minutes, 3-4 times a day. Limit salt in your diet. Prenatal care  Schedule your prenatal visits by the twelfth week of pregnancy. They are usually scheduled monthly at first, then more often in the last 2 months before delivery.  Write down your questions. Take them to your prenatal visits.  Keep all your prenatal visits as told by your health care provider. This is important. Safety  Wear your seat belt at all times when driving.  Make a list of emergency phone numbers, including numbers for family, friends, the hospital, and police and fire departments. General instructions  Ask your health care provider for a referral to a local prenatal education class. Begin classes no later than the beginning of month 6 of your pregnancy.  Ask for help if you have counseling or nutritional needs during pregnancy. Your health care provider can offer advice or refer you to specialists for help  with various needs.  Do not use hot tubs, steam rooms, or saunas.  Do not douche or use tampons or scented sanitary pads.  Do not cross your legs for long periods of time.  Avoid cat litter boxes and soil used by cats. These carry germs that can cause birth defects in the baby and possibly loss of the fetus by miscarriage or stillbirth.  Avoid all smoking, herbs, alcohol, and medicines not prescribed by your health care provider. Chemicals in these products affect the formation and growth of the baby.  Do not use any products that contain nicotine or tobacco, such as cigarettes and e-cigarettes. If you need help quitting, ask your health care provider. You may receive counseling support and other resources to help you quit.  Schedule a dentist appointment. At home, brush your teeth with a soft toothbrush and be gentle when you floss. Contact a health care provider if:  You have dizziness.  You have mild pelvic cramps, pelvic pressure, or nagging pain in the abdominal area.  You have persistent nausea, vomiting, or diarrhea.  You have a bad smelling vaginal discharge.  You have pain when you urinate.  You notice increased swelling in your face, hands, legs, or ankles.  You are exposed to fifth disease or chickenpox.  You are exposed to German measles (rubella) and have never had it. Get help right away if:  You have a fever.  You are leaking fluid from your vagina.  You have spotting or bleeding from your vagina.  You have severe abdominal cramping or pain.  You have rapid weight gain or loss.  You vomit blood or material that looks like coffee grounds.  You develop a severe headache.  You have shortness of breath.  You have any kind of trauma, such as from a fall or a car accident. Summary  The first trimester of pregnancy is from week 1 until the end of week 13 (months 1 through 3).  Your body goes through many changes during pregnancy. The changes vary from  woman to woman.  You will have routine prenatal visits. During those visits, your health care provider will examine you, discuss any test results you may have, and talk with you about how you are feeling. This information is not intended to replace advice given to you by your health care provider. Make sure you discuss any questions you have with your health care provider. Document Released: 04/11/2001 Document Revised: 03/29/2016 Document Reviewed: 03/29/2016 Elsevier Interactive Patient Education  2017 Elsevier   Inc.  

## 2017-03-08 NOTE — Progress Notes (Addendum)
New ob packet given  First Trimester Screen scheduled for November 16th @ 0915.  Pt notified.  Declined flu vaccine  Home Medicaid Form Completed

## 2017-03-09 LAB — OBSTETRIC PANEL, INCLUDING HIV
Antibody Screen: NEGATIVE
BASOS ABS: 0 10*3/uL (ref 0.0–0.2)
Basos: 0 %
EOS (ABSOLUTE): 0 10*3/uL (ref 0.0–0.4)
Eos: 0 %
HIV SCREEN 4TH GENERATION: NONREACTIVE
Hematocrit: 31.6 % — ABNORMAL LOW (ref 34.0–46.6)
Hemoglobin: 10.2 g/dL — ABNORMAL LOW (ref 11.1–15.9)
Hepatitis B Surface Ag: NEGATIVE
IMMATURE GRANS (ABS): 0 10*3/uL (ref 0.0–0.1)
Immature Granulocytes: 0 %
LYMPHS: 19 %
Lymphocytes Absolute: 1.6 10*3/uL (ref 0.7–3.1)
MCH: 25.7 pg — ABNORMAL LOW (ref 26.6–33.0)
MCHC: 32.3 g/dL (ref 31.5–35.7)
MCV: 80 fL (ref 79–97)
MONOCYTES: 9 %
MONOS ABS: 0.7 10*3/uL (ref 0.1–0.9)
NEUTROS ABS: 5.9 10*3/uL (ref 1.4–7.0)
Neutrophils: 72 %
PLATELETS: 283 10*3/uL (ref 150–379)
RBC: 3.97 x10E6/uL (ref 3.77–5.28)
RDW: 15.6 % — AB (ref 12.3–15.4)
RPR Ser Ql: NONREACTIVE
RUBELLA: 1.41 {index} (ref >0.99)
Rh Factor: NEGATIVE
WBC: 8.2 10*3/uL (ref 3.4–10.8)

## 2017-03-09 LAB — HEMOGLOBINOPATHY EVALUATION
HGB C: 0 %
HGB S: 0 %
HGB VARIANT: 0 %
Hemoglobin A2 Quantitation: 5.8 % — ABNORMAL HIGH (ref 1.8–3.2)
Hemoglobin F Quantitation: 1.2 % (ref 0.0–2.0)
Hgb A: 93 % — ABNORMAL LOW (ref 96.4–98.8)

## 2017-03-09 LAB — CYTOLOGY - PAP
Diagnosis: NEGATIVE
HPV (WINDOPATH): NOT DETECTED

## 2017-03-10 LAB — CULTURE, OB URINE

## 2017-03-10 LAB — URINE CULTURE, OB REFLEX

## 2017-03-14 ENCOUNTER — Other Ambulatory Visit: Payer: Self-pay | Admitting: General Practice

## 2017-03-14 DIAGNOSIS — O09521 Supervision of elderly multigravida, first trimester: Secondary | ICD-10-CM

## 2017-03-15 ENCOUNTER — Telehealth: Payer: Self-pay | Admitting: General Practice

## 2017-03-15 NOTE — Telephone Encounter (Signed)
Patient already has genetic counseling appt set up with MFM for tomorrow. MFM notified. Called and informed patient of results. Discussed this doesn't mean anything for her but MFM will talk about possible implications for baby, but nothing to worry about right now. Patient verbalized understanding & had no questions

## 2017-03-15 NOTE — Telephone Encounter (Signed)
-----   Message from Hermina StaggersMichael L Ervin, MD sent at 03/12/2017 12:50 PM EST ----- Please refer pt to MFM for consult regarding Beta Thalassemia  Thanks Casimiro NeedleMichael

## 2017-03-16 ENCOUNTER — Other Ambulatory Visit: Payer: Self-pay | Admitting: Obstetrics and Gynecology

## 2017-03-16 ENCOUNTER — Ambulatory Visit (HOSPITAL_COMMUNITY)
Admission: RE | Admit: 2017-03-16 | Discharge: 2017-03-16 | Disposition: A | Discharge status: Home / Self Care | Payer: Medicare Other | Source: Ambulatory Visit | Attending: Obstetrics and Gynecology | Admitting: Obstetrics and Gynecology

## 2017-03-16 ENCOUNTER — Encounter (HOSPITAL_COMMUNITY): Payer: Self-pay

## 2017-03-16 DIAGNOSIS — O09521 Supervision of elderly multigravida, first trimester: Secondary | ICD-10-CM

## 2017-03-16 DIAGNOSIS — Z3682 Encounter for antenatal screening for nuchal translucency: Secondary | ICD-10-CM | POA: Diagnosis present

## 2017-03-16 DIAGNOSIS — Z3A13 13 weeks gestation of pregnancy: Secondary | ICD-10-CM

## 2017-03-16 DIAGNOSIS — O352XX0 Maternal care for (suspected) hereditary disease in fetus, not applicable or unspecified: Secondary | ICD-10-CM | POA: Insufficient documentation

## 2017-03-16 DIAGNOSIS — Z3490 Encounter for supervision of normal pregnancy, unspecified, unspecified trimester: Secondary | ICD-10-CM

## 2017-03-16 NOTE — Progress Notes (Signed)
Appointment Date: 03/16/2017 DOB: 09/10/1980 Referring Provider: Hermina StaggersErvin, Michael L, MD Attending: Dr. Particia NearingMartha Decker  [name] was seen for genetic counseling because of a maternal age of 36 y.o..     In summary:  Discussed AMA and associated risk for fetal aneuploidy  Discussed options for screening  First screen  Quad screen  NIPS - Panorama drawn today  Ultrasound  Discussed diagnostic testing options - declined  [facility]  Amniocentesis  Reviewed family history concerns  Beta thalassemia carrier  Discussed carrier screening options - declined  CF  SMA  She was counseled regarding maternal age and the association with risk for chromosome conditions due to nondisjunction with aging of the ova.   We reviewed chromosomes, nondisjunction, and the associated 1 in 7266 risk for fetal aneuploidy related to a maternal age of 36 y.o. at 2246w3d gestation.  She was counseled that the risk for aneuploidy decreases as gestational age increases, accounting for those pregnancies which spontaneously abort.  We specifically discussed Down syndrome (trisomy 1321), trisomies 7613 and 8718, and sex chromosome aneuploidies (47,XXX and 47,XXY) including the common features and prognoses of each.   We reviewed available screening options including First Screen, Quad screen, noninvasive prenatal screening (NIPS)/cell free DNA (cfDNA) screening, and detailed ultrasound.  She was counseled that screening tests are used to modify a patient's a priori risk for aneuploidy, typically based on age. This estimate provides a pregnancy specific risk assessment. We reviewed the benefits and limitations of each option. Specifically, we discussed the conditions for which each test screens, the detection rates, and false positive rates of each. She was also counseled regarding diagnostic testing via [facility] and amniocentesis. We reviewed the approximate 1 in 300-500 risk for complications from amniocentesis, including  spontaneous pregnancy loss. We discussed the possible results that the tests might provide including: positive, negative, unanticipated, and no result. Finally, she was counseled regarding the cost of each option and potential out of pocket expenses.  After consideration of all the options, she elected to proceed with NIPS.  Those results will be available in 8-10 days.    [name] was provided with written information regarding cystic fibrosis (CF), spinal muscular atrophy (SMA) and hemoglobinopathies including the carrier frequency, availability of carrier screening and prenatal diagnosis if indicated.  In addition, we discussed that CF and hemoglobinopathies are routinely screened for as part of the [place] newborn screening panel.  After further discussion, she declined screening for CF and SMA.  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   [name] was counseled that beta-thalassemia is a genetic condition characterized by reduced synthesis of the beta subunit of hemoglobin (beta globin). We reviewed that hemoglobin is a protein in red blood cells that carries oxygen to the body's organs. There are multiple types of hemoglobin, and these are comprised of different subunits.  The primary adult hemoglobin, denoted as hemoglobin A (Hb A) is made up of two alpha and two beta globin units. Individuals who have beta-thalassemia have changes within the genes that code for beta globin. The resulting imbalance in the ratio of the alpha chains to the beta chains causes the underlying pathology. In beta-thalassemia, the alpha chains are produced in relative excess. Therefore, because of the absence of the beta chains with which to form a tetramer, the excess alpha chains will precipitate in the cell damaging the membrane and leading to premature RBC destruction. This results in hypochromic, microcytic anemia.  The anemia is severe and typically leads to  hepatosplenomegaly for individuals with beta thalassemia.  Because the beta chain is not used for hemoglobin production during fetal life, the onset of symptoms in beta-thalassemia is not until a few months of age. Beta thalassemia is typically treated with regular transfusions and chelation therapy, aimed at reducing transfusion iron overload.  Without treatment, individuals with beta-thalassemia have failure to thrive, feeding problems, and a shortened lifespan. Elevation of hemoglobin A2 (alpha2/delta2) occurs uniquely in beta-thalassemia carriers, and this is what is typically found on hemoglobin electrophoresis for beta thalassemia carriers. Continued production of the delta chains allows for tetramer formation between the alpha and delta chains. Beta-thalassemia produces a variable phenotype dependent upon the severity of the mutations.    [name] was counseled that beta-thalassemia is inherited in an autosomal recessive manner, and occurs when both copies of the beta hemoglobin gene are changed. Typically, one abnormal beta globin gene is inherited from each parent. We discussed that beta thalassemia trait can be inherited with other beta globin chain variants, such as sickle cell trait (Hb AS) to also cause other variant hemoglobinopathies, such as sickle beta-thalassemia.  Beta-thalassemia minor occurs when an individual has one altered copy of the beta globin gene and one typical working copy of the beta globin gene.  This is also referred to as being a "carrier" of beta-thalassemia.  Carriers of recessive conditions typically do not have symptoms related to the condition, because they still have one functioning copy of the gene, and thus some production of the typical protein coded for by that gene.   Given the recessive inheritance, we discussed the importance of understanding the carrier status of the father of the pregnancy in order to accurately predict the risk of beta-thalassemia or other  hemoglobinopathy in the fetus. We discussed the option of hemoglobin electrophoresis to determine whether he has beta-thalassemia trait, or any hemoglobin variant (including sickle cell trait). The father of the pregnancy was not present at today's visit. We discussed that this screening can be facilitated through our office, likely his physician's office or the patient's OB office. Additionally, we discussed that carrier screening is provided through [facility] and [facility].  She understands that an accurate risk assessment cannot be performed without further information.  We discussed that approximately 1 in 10 individuals with African-American ancestry have beta-thalassemia minor or sickle cell trait.  This considered, the risk for beta-thalassemia or other hemoglobinopathy in the fetus is approximately 1 in 6340 [1/10 (chance that the father of the pregnancy is a carrier) x 1 (chance that [name] is carrier) x  (chance that both would pass on the changed gene)].   She understands that if the father of the pregnancy has typical hemoglobin, then the pregnancy would not be at risk to inherit a hemoglobinopathy but would have a 1 in 2 chance to have beta-thalassemia trait like Ms. Bleecker.   Both family histories were reviewed and otherwise found to be noncontributory for birth defects, intellectual disability, and known genetic conditions. Without further information regarding the provided family history, an accurate genetic risk cannot be calculated. Further genetic counseling is warranted if more information is obtained.  [name] denied exposure to environmental toxins or chemical agents. She denied the use of alcohol, tobacco or street drugs. She denied significant viral illnesses during the course of her pregnancy. Her medical and surgical histories were noncontributory.   I counseled [name] regarding the above risks and available  options.  The approximate face-to-face time  with the genetic counselor was 50 minutes.  Mady Gemma, MS,  Certified Genetic Counselor

## 2017-03-19 ENCOUNTER — Encounter: Payer: Self-pay | Admitting: Obstetrics & Gynecology

## 2017-03-19 DIAGNOSIS — O99019 Anemia complicating pregnancy, unspecified trimester: Secondary | ICD-10-CM | POA: Insufficient documentation

## 2017-03-19 DIAGNOSIS — O26899 Other specified pregnancy related conditions, unspecified trimester: Secondary | ICD-10-CM

## 2017-03-19 DIAGNOSIS — Z6791 Unspecified blood type, Rh negative: Secondary | ICD-10-CM | POA: Insufficient documentation

## 2017-03-27 ENCOUNTER — Telehealth (HOSPITAL_COMMUNITY): Payer: Self-pay | Admitting: Genetics

## 2017-03-27 NOTE — Telephone Encounter (Signed)
Called Angela Andrade to discuss her prenatal cell free DNA test results.  Mrs. Angela Andrade had [facility] testing [facility].  Testing was offered because of AMA.   The patient was identified by name and DOB.  We reviewed that these are within normal limits, showing a less than 1 in 10,000 risk for trisomies 21, 18 and 13, and monosomy X (Turner syndrome).  In addition, the risk for triploidy and sex chromosome trisomies (47,XXX and 47,XXY) was also low risk.  We reviewed that this testing identifies > 99% of pregnancies with trisomy 7321, trisomy 6613, sex chromosome trisomies (47,XXX and 47,XXY), and triploidy. The detection rate for trisomy 18 is 96%.  The detection rate for monosomy X is ~92%.  The false positive rate is <0.1% for all conditions. Testing was also consistent with female fetal sex.  The patient did wish to know fetal sex.  She understands that this testing does not identify all genetic conditions.  All questions were answered to her satisfaction, she was encouraged to call with additional questions or concerns.  Angela Gemmaaragh Wylee Ogden, MS Certified Genetic Counselor

## 2017-04-02 ENCOUNTER — Other Ambulatory Visit (HOSPITAL_COMMUNITY): Payer: Self-pay

## 2017-04-02 DIAGNOSIS — D563 Thalassemia minor: Secondary | ICD-10-CM

## 2017-04-02 HISTORY — DX: Thalassemia minor: D56.3

## 2017-04-03 ENCOUNTER — Other Ambulatory Visit (HOSPITAL_COMMUNITY): Payer: Self-pay

## 2017-04-05 ENCOUNTER — Ambulatory Visit (INDEPENDENT_AMBULATORY_CARE_PROVIDER_SITE_OTHER): Payer: Medicare Other | Admitting: Student

## 2017-04-05 ENCOUNTER — Other Ambulatory Visit (HOSPITAL_COMMUNITY)
Admission: RE | Admit: 2017-04-05 | Discharge: 2017-04-05 | Disposition: A | Discharge status: Home / Self Care | Payer: Medicare Other | Source: Ambulatory Visit | Attending: Student | Admitting: Student

## 2017-04-05 VITALS — BP 114/57 | HR 80 | Wt 140.6 lb

## 2017-04-05 DIAGNOSIS — A5901 Trichomonal vulvovaginitis: Secondary | ICD-10-CM | POA: Diagnosis not present

## 2017-04-05 DIAGNOSIS — O26899 Other specified pregnancy related conditions, unspecified trimester: Secondary | ICD-10-CM | POA: Diagnosis present

## 2017-04-05 DIAGNOSIS — O26892 Other specified pregnancy related conditions, second trimester: Secondary | ICD-10-CM

## 2017-04-05 DIAGNOSIS — O0990 Supervision of high risk pregnancy, unspecified, unspecified trimester: Secondary | ICD-10-CM

## 2017-04-05 DIAGNOSIS — D563 Thalassemia minor: Secondary | ICD-10-CM

## 2017-04-05 DIAGNOSIS — Z3A16 16 weeks gestation of pregnancy: Secondary | ICD-10-CM | POA: Diagnosis not present

## 2017-04-05 DIAGNOSIS — N898 Other specified noninflammatory disorders of vagina: Secondary | ICD-10-CM | POA: Diagnosis present

## 2017-04-05 DIAGNOSIS — O352XX0 Maternal care for (suspected) hereditary disease in fetus, not applicable or unspecified: Secondary | ICD-10-CM

## 2017-04-05 DIAGNOSIS — O98312 Other infections with a predominantly sexual mode of transmission complicating pregnancy, second trimester: Secondary | ICD-10-CM | POA: Diagnosis not present

## 2017-04-05 DIAGNOSIS — O0992 Supervision of high risk pregnancy, unspecified, second trimester: Secondary | ICD-10-CM

## 2017-04-05 NOTE — Progress Notes (Signed)
   PRENATAL VISIT NOTE  Subjective:  Angela Andrade is a 36 y.o. [phone] at 5358w2d being seen today for ongoing prenatal care.  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  Patient reports increased discharge. Denies pain, itching, odor but says she just feels more moist. . Has only had intercourse one time in the past month but is concerned about STI.  Contractions: Not present. Vag. Bleeding: None.  Movement: Absent. Denies leaking of fluid.   The following portions of the patient's history were reviewed and updated as appropriate: allergies, current medications, past family history, past medical history, past social history, past surgical history and problem list. Problem list updated.  Objective:   Vitals:   04/05/17 0930  BP: (!) 114/57  Pulse: 80  Weight: 140 lb 9.6 oz (63.8 kg)    Fetal Status: Fetal Heart Rate (bpm): 160   Movement: Absent     General:  Alert, oriented and cooperative. Patient is in no acute distress.  Skin: Skin is warm and dry. No rash noted.   Cardiovascular: Normal heart rate noted  Respiratory: Normal respiratory effort, no problems with respiration noted  Abdomen: Soft, gravid, appropriate for gestational age.  Pain/Pressure: Present     Pelvic: Cervical exam deferred    NEFG; scant white discharge in the vagina. Vaginal walls slighty erythematous; no odor, bleeding, CMT or supra/adnexal tenderness.     Extremities: Normal range of motion.  Edema: None  Mental Status:   Normal mood and affect. Normal behavior. Normal judgment and thought content.   Assessment and Plan:  Pregnancy: G4P3003 at 4858w2d  1. Supervision of high risk pregnancy, antepartum - US MFM OB DETAIL +14 WK; Future - Wet prep, genital  2. Beta thalassemia minor FOB does not plan to get tested.   3. Vaginal discharge during pregnancy, antepartum  - Cervicovaginal ancillary only  4. Hereditary disease in family possibly affecting fetus, affecting management of mother, antepartum condition or complication, not applicable or unspecified fetus  Preterm labor symptoms and general obstetric precautions including but not limited to vaginal bleeding, contractions, leaking of fluid and fetal movement were reviewed in detail with the patient. Please refer to After Visit Summary for other counseling recommendations.  Return in about 4 weeks (around 05/03/2017).   Marylene LandKathryn Lorraine Kooistra, CNM

## 2017-04-05 NOTE — Patient Instructions (Signed)

## 2017-04-07 ENCOUNTER — Other Ambulatory Visit: Payer: Self-pay | Admitting: Student

## 2017-04-07 DIAGNOSIS — A599 Trichomoniasis, unspecified: Secondary | ICD-10-CM | POA: Insufficient documentation

## 2017-04-07 LAB — CERVICOVAGINAL ANCILLARY ONLY
BACTERIAL VAGINITIS: NEGATIVE
Candida vaginitis: NEGATIVE
Chlamydia: NEGATIVE
Neisseria Gonorrhea: NEGATIVE
Trichomonas: POSITIVE — AB

## 2017-04-07 MED ORDER — ONDANSETRON HCL 8 MG PO TABS: 8.0000 mg | ORAL_TABLET | Freq: Once | ORAL | 0 refills | Status: AC

## 2017-04-07 MED ORDER — METRONIDAZOLE 500 MG PO TABS: 2000.0000 mg | ORAL_TABLET | Freq: Three times a day (TID) | ORAL | 0 refills | Status: DC

## 2017-04-11 ENCOUNTER — Telehealth: Payer: Self-pay

## 2017-04-11 NOTE — Telephone Encounter (Addendum)
Per Susanne BordersKathyrn Kooistra, CNM, please call patient and inform her of trich results; I also sent her zofran to take 1 time 30 min before she takes her 2 grams of flagyl. It will help with the nausea associated with 2 grams of flagyl. Patient's partner should get tested and treated; abstain from intercourse until that time. Called patient about positive trich results. Patient stated that she received results from her my chart. Patient verbalized understanding and had no questions.

## 2017-04-26 ENCOUNTER — Encounter (HOSPITAL_COMMUNITY): Payer: Self-pay

## 2017-04-26 ENCOUNTER — Ambulatory Visit (HOSPITAL_COMMUNITY)
Admission: RE | Admit: 2017-04-26 | Discharge: 2017-04-26 | Disposition: A | Discharge status: Home / Self Care | Payer: Medicare Other | Source: Ambulatory Visit | Attending: Student | Admitting: Student

## 2017-04-26 DIAGNOSIS — O09522 Supervision of elderly multigravida, second trimester: Secondary | ICD-10-CM | POA: Insufficient documentation

## 2017-04-26 DIAGNOSIS — O0992 Supervision of high risk pregnancy, unspecified, second trimester: Secondary | ICD-10-CM | POA: Insufficient documentation

## 2017-04-26 DIAGNOSIS — Z3689 Encounter for other specified antenatal screening: Secondary | ICD-10-CM | POA: Diagnosis not present

## 2017-04-26 DIAGNOSIS — O0990 Supervision of high risk pregnancy, unspecified, unspecified trimester: Secondary | ICD-10-CM

## 2017-04-26 DIAGNOSIS — Z3A19 19 weeks gestation of pregnancy: Secondary | ICD-10-CM | POA: Insufficient documentation

## 2017-05-01 NOTE — L&D Delivery Note (Addendum)
Delivery Note At 5:59 PM a viable and healthy female was delivered via Vaginal, Spontaneous (Presentation: ROA  ).  APGAR: 7, 9; weight pending .   Placenta status: spontaneous;intact.  Cord:  with the following complications: none.    Delivery of the head: 08/29/2017  5:58 PM First maneuver: 08/29/2017  5:58 PM, McRoberts Second maneuver: 08/29/2017  5:59 PM, Suprapubic Pressure   Verbal consent: obtained from patient. Anesthesia:  epidural Episiotomy: None Lacerations: None Suture Repair: n/a Est. Blood Loss (mL): 100  Mom to postpartum.  Baby to Couplet care / Skin to Skin.  Angela Andrade is a 37 y.o. female [phone] with IUP at [redacted]w[redacted]d admitted for SOL .  She progressed without augmentation to complete and pushed less than 10 minutes to deliver. Shoulder dystocia lasted less than 30 seconds and resolved with McRoberts and suprapubic pressure. Cord clamping delayed by 1 minute then clamped by CNM and cut by CNM.  Placenta intact and spontaneous, bleeding minimal.  Intact perineum.  Mom and baby stable prior to transfer to postpartum. She plans on breastfeeding. She requests Nexplanon vs POPs for birth control.   Sharen Counter 08/29/2017, 6:35 PM

## 2017-05-10 ENCOUNTER — Ambulatory Visit (INDEPENDENT_AMBULATORY_CARE_PROVIDER_SITE_OTHER): Payer: Medicare Other | Admitting: Certified Nurse Midwife

## 2017-05-10 VITALS — BP 116/62 | HR 69 | Wt 152.5 lb

## 2017-05-10 DIAGNOSIS — O0990 Supervision of high risk pregnancy, unspecified, unspecified trimester: Secondary | ICD-10-CM

## 2017-05-10 NOTE — Patient Instructions (Signed)
Second Trimester of Pregnancy The second trimester is from week 13 through week 28, month 4 through 6. This is often the time in pregnancy that you feel your best. Often times, morning sickness has lessened or quit. You may have more energy, and you may get hungry more often. Your unborn baby (fetus) is growing rapidly. At the end of the sixth month, he or she is about 9 inches long and weighs about 1 pounds. You will likely feel the baby move (quickening) between 18 and 20 weeks of pregnancy. Follow these instructions at home:  Avoid all smoking, herbs, and alcohol. Avoid drugs not approved by your doctor.  Do not use any tobacco products, including cigarettes, chewing tobacco, and electronic cigarettes. If you need help quitting, ask your doctor. You may get counseling or other support to help you quit.  Only take medicine as told by your doctor. Some medicines are safe and some are not during pregnancy.  Exercise only as told by your doctor. Stop exercising if you start having cramps.  Eat regular, healthy meals.  Wear a good support bra if your breasts are tender.  Do not use hot tubs, steam rooms, or saunas.  Wear your seat belt when driving.  Avoid raw meat, uncooked cheese, and liter boxes and soil used by cats.  Take your prenatal vitamins.  Take 1500-2000 milligrams of calcium daily starting at the 20th week of pregnancy until you deliver your baby.  Try taking medicine that helps you poop (stool softener) as needed, and if your doctor approves. Eat more fiber by eating fresh fruit, vegetables, and whole grains. Drink enough fluids to keep your pee (urine) clear or pale yellow.  Take warm water baths (sitz baths) to soothe pain or discomfort caused by hemorrhoids. Use hemorrhoid cream if your doctor approves.  If you have puffy, bulging veins (varicose veins), wear support hose. Raise (elevate) your feet for 15 minutes, 3-4 times a day. Limit salt in your diet.  Avoid heavy  lifting, wear low heals, and sit up straight.  Rest with your legs raised if you have leg cramps or low back pain.  Visit your dentist if you have not gone during your pregnancy. Use a soft toothbrush to brush your teeth. Be gentle when you floss.  You can have sex (intercourse) unless your doctor tells you not to.  Go to your doctor visits. Get help if:  You feel dizzy.  You have mild cramps or pressure in your lower belly (abdomen).  You have a nagging pain in your belly area.  You continue to feel sick to your stomach (nauseous), throw up (vomit), or have watery poop (diarrhea).  You have bad smelling fluid coming from your vagina.  You have pain with peeing (urination). Get help right away if:  You have a fever.  You are leaking fluid from your vagina.  You have spotting or bleeding from your vagina.  You have severe belly cramping or pain.  You lose or gain weight rapidly.  You have trouble catching your breath and have chest pain.  You notice sudden or extreme puffiness (swelling) of your face, hands, ankles, feet, or legs.  You have not felt the baby move in over an hour.  You have severe headaches that do not go away with medicine.  You have vision changes. This information is not intended to replace advice given to you by your health care provider. Make sure you discuss any questions you have with your health care   provider. Document Released: 07/12/2009 Document Revised: 09/23/2015 Document Reviewed: 06/18/2012 Elsevier Interactive Patient Education  2017 Elsevier Inc.  

## 2017-05-10 NOTE — Progress Notes (Signed)
   PRENATAL VISIT NOTE  Subjective:  Angela Andrade is a 37 y.o. G4P3003 at 8517w2d being seen today for ongoing prenatal care.  She is currently monitored for the following issues for this high-risk pregnancy and has Mood disorder (HCC); Bipolar I disorder, most recent episode mixed (HCC); Supervision of high risk pregnancy, antepartum; Morning sickness; Advanced maternal age in multigravida, first trimester; Hereditary disease in family possibly affecting fetus, affecting management of mother, antepartum condition or complication, not applicable or unspecified fetus; Rh negative, antepartum; Anemia, antepartum; Beta thalassemia minor; and Trichomoniasis on their problem list.  Patient reports no complaints.  Contractions: Not present. Vag. Bleeding: None.  Movement: Present. Denies leaking of fluid.   The following portions of the patient's history were reviewed and updated as appropriate: allergies, current medications, past family history, past medical history, past social history, past surgical history and problem list. Problem list updated.  Objective:   Vitals:   05/10/17 1032  BP: 116/62  Pulse: 69  Weight: 152 lb 8 oz (69.2 kg)    Fetal Status: Fetal Heart Rate (bpm): 158   Movement: Present     General:  Alert, oriented and cooperative. Patient is in no acute distress.  Skin: Skin is warm and dry. No rash noted.   Cardiovascular: Normal heart rate noted  Respiratory: Normal respiratory effort, no problems with respiration noted  Abdomen: Soft, gravid, appropriate for gestational age.  Pain/Pressure: Absent     Pelvic: Cervical exam deferred        Extremities: Normal range of motion.  Edema: None  Mental Status:  Normal mood and affect. Normal behavior. Normal judgment and thought content.   Assessment and Plan:  Pregnancy: G4P3003 at 2117w2d  1. Supervision of high risk pregnancy, antepartum -Feels good, no complaints currently  -Anticipatory guidance on upcoming visits    -Follow up US to complete anatomy scheduled for 05/29/2017 - US MFM OB FOLLOW UP; Future   Preterm labor symptoms and general obstetric precautions including but not limited to vaginal bleeding, contractions, leaking of fluid and fetal movement were reviewed in detail with the patient. Please refer to After Visit Summary for other counseling recommendations.  Return in about 4 weeks (around 06/07/2017) for ROB.   Sharyon CableVeronica C Cesareo Vickrey, CNM

## 2017-05-29 ENCOUNTER — Ambulatory Visit (HOSPITAL_COMMUNITY)
Admission: RE | Admit: 2017-05-29 | Discharge: 2017-05-29 | Disposition: A | Discharge status: Home / Self Care | Payer: Medicare Other | Source: Ambulatory Visit | Attending: Certified Nurse Midwife | Admitting: Certified Nurse Midwife

## 2017-05-29 ENCOUNTER — Encounter (HOSPITAL_COMMUNITY): Payer: Self-pay

## 2017-05-29 DIAGNOSIS — Z362 Encounter for other antenatal screening follow-up: Secondary | ICD-10-CM | POA: Insufficient documentation

## 2017-05-29 DIAGNOSIS — O0992 Supervision of high risk pregnancy, unspecified, second trimester: Secondary | ICD-10-CM | POA: Diagnosis not present

## 2017-05-29 DIAGNOSIS — O0990 Supervision of high risk pregnancy, unspecified, unspecified trimester: Secondary | ICD-10-CM

## 2017-05-29 DIAGNOSIS — Z3A24 24 weeks gestation of pregnancy: Secondary | ICD-10-CM | POA: Diagnosis not present

## 2017-05-29 DIAGNOSIS — O09522 Supervision of elderly multigravida, second trimester: Secondary | ICD-10-CM | POA: Diagnosis not present

## 2017-05-29 NOTE — Addendum Note (Signed)
Encounter addended by: Lenoard AdenJohnson, Apollo Timothy M, RDMS on: 05/29/2017 9:56 AM  Actions taken: Imaging Exam ended

## 2017-06-08 ENCOUNTER — Ambulatory Visit (INDEPENDENT_AMBULATORY_CARE_PROVIDER_SITE_OTHER): Payer: Medicare Other | Admitting: Obstetrics and Gynecology

## 2017-06-08 VITALS — BP 122/80 | HR 67 | Wt 160.0 lb

## 2017-06-08 DIAGNOSIS — O0990 Supervision of high risk pregnancy, unspecified, unspecified trimester: Secondary | ICD-10-CM

## 2017-06-08 DIAGNOSIS — R42 Dizziness and giddiness: Secondary | ICD-10-CM

## 2017-06-08 DIAGNOSIS — D563 Thalassemia minor: Secondary | ICD-10-CM

## 2017-06-08 DIAGNOSIS — O0992 Supervision of high risk pregnancy, unspecified, second trimester: Secondary | ICD-10-CM

## 2017-06-08 NOTE — Progress Notes (Signed)
Pt declined Flu 

## 2017-06-08 NOTE — Progress Notes (Signed)
Pt states has been light headed and getting really hot, has to lay down to get relief

## 2017-06-08 NOTE — Progress Notes (Addendum)
   PRENATAL VISIT NOTE  Subjective:  Angela Andrade is a 37 y.o. G4P3003 at 4244w3d being seen today for ongoing prenatal care.  She is currently monitored for the following issues for this high-risk pregnancy and has Mood disorder (HCC); Bipolar I disorder, most recent episode mixed (HCC); Supervision of high risk pregnancy, antepartum; Morning sickness; Advanced maternal age in multigravida, first trimester; Hereditary disease in family possibly affecting fetus, affecting management of mother, antepartum condition or complication, not applicable or unspecified fetus; Rh negative, antepartum; Anemia, antepartum; Beta thalassemia minor; and Trichomoniasis on their problem list.  Patient reports fatigue and dizziness .  Contractions: Irritability. Vag. Bleeding: None.  Movement: Present. Denies leaking of fluid.   The following portions of the patient's history were reviewed and updated as appropriate: allergies, current medications, past family history, past medical history, past social history, past surgical history and problem list. Problem list updated.  Objective:   Vitals:   06/08/17 0853  BP: 122/80  Pulse: 67  Weight: 160 lb (72.6 kg)    Fetal Status: Fetal Heart Rate (bpm): 172 Fundal Height: 24 cm Movement: Present     General:  Alert, oriented and cooperative. Patient is in no acute distress.  Skin: Skin is warm and dry. No rash noted.   Cardiovascular: Normal heart rate noted  Respiratory: Normal respiratory effort, no problems with respiration noted  Abdomen: Soft, gravid, appropriate for gestational age.  Pain/Pressure: Present     Pelvic: Cervical exam deferred        Extremities: Normal range of motion.  Edema: None  Mental Status:  Normal mood and affect. Normal behavior. Normal judgment and thought content.   Assessment and Plan:  Pregnancy: G4P3003 at 9544w3d  1. Dizziness  - CBC today  -Occurs when she stands up. Says she gets hot when she feels dizzy. - Start iron,  [place] with iron twice daily.   2. Supervision of high risk pregnancy, antepartum  Doing well today   3. Beta thalassemia minor  FOB does not want to get tested   Preterm labor symptoms and general obstetric precautions including but not limited to vaginal bleeding, contractions, leaking of fluid and fetal movement were reviewed in detail with the patient. Please refer to After Visit Summary for other counseling recommendations.  Return in about 4 weeks (around 07/06/2017) for In 4 weeks with MD- high risk patient .   Venia CarbonJennifer Treazure Nery, NP

## 2017-06-08 NOTE — Patient Instructions (Signed)

## 2017-06-09 LAB — CBC
Hematocrit: 31.1 % — ABNORMAL LOW (ref 34.0–46.6)
Hemoglobin: 9.7 g/dL — ABNORMAL LOW (ref 11.1–15.9)
MCH: 25.1 pg — ABNORMAL LOW (ref 26.6–33.0)
MCHC: 31.2 g/dL — ABNORMAL LOW (ref 31.5–35.7)
MCV: 81 fL (ref 79–97)
PLATELETS: 252 10*3/uL (ref 150–379)
RBC: 3.86 x10E6/uL (ref 3.77–5.28)
RDW: 15.8 % — ABNORMAL HIGH (ref 12.3–15.4)
WBC: 7.3 10*3/uL (ref 3.4–10.8)

## 2017-07-09 ENCOUNTER — Other Ambulatory Visit: Payer: Self-pay

## 2017-07-09 ENCOUNTER — Ambulatory Visit (INDEPENDENT_AMBULATORY_CARE_PROVIDER_SITE_OTHER): Payer: Medicare Other | Admitting: Family Medicine

## 2017-07-09 ENCOUNTER — Other Ambulatory Visit (HOSPITAL_COMMUNITY)
Admission: RE | Admit: 2017-07-09 | Discharge: 2017-07-09 | Disposition: A | Discharge status: Home / Self Care | Payer: Medicare Other | Source: Ambulatory Visit | Attending: Obstetrics and Gynecology | Admitting: Obstetrics and Gynecology

## 2017-07-09 VITALS — BP 116/64 | HR 79 | Wt 164.6 lb

## 2017-07-09 DIAGNOSIS — Z23 Encounter for immunization: Secondary | ICD-10-CM | POA: Diagnosis not present

## 2017-07-09 DIAGNOSIS — O0991 Supervision of high risk pregnancy, unspecified, first trimester: Secondary | ICD-10-CM

## 2017-07-09 DIAGNOSIS — F3160 Bipolar disorder, current episode mixed, unspecified: Secondary | ICD-10-CM | POA: Diagnosis not present

## 2017-07-09 DIAGNOSIS — Z6791 Unspecified blood type, Rh negative: Secondary | ICD-10-CM | POA: Diagnosis not present

## 2017-07-09 DIAGNOSIS — D563 Thalassemia minor: Secondary | ICD-10-CM | POA: Insufficient documentation

## 2017-07-09 DIAGNOSIS — O09893 Supervision of other high risk pregnancies, third trimester: Secondary | ICD-10-CM | POA: Diagnosis not present

## 2017-07-09 DIAGNOSIS — O99343 Other mental disorders complicating pregnancy, third trimester: Secondary | ICD-10-CM | POA: Diagnosis not present

## 2017-07-09 DIAGNOSIS — O0990 Supervision of high risk pregnancy, unspecified, unspecified trimester: Secondary | ICD-10-CM

## 2017-07-09 DIAGNOSIS — Z3A29 29 weeks gestation of pregnancy: Secondary | ICD-10-CM | POA: Insufficient documentation

## 2017-07-09 DIAGNOSIS — O09521 Supervision of elderly multigravida, first trimester: Secondary | ICD-10-CM | POA: Diagnosis present

## 2017-07-09 DIAGNOSIS — O09523 Supervision of elderly multigravida, third trimester: Secondary | ICD-10-CM | POA: Diagnosis not present

## 2017-07-09 DIAGNOSIS — O26899 Other specified pregnancy related conditions, unspecified trimester: Secondary | ICD-10-CM

## 2017-07-09 MED ORDER — RHO D IMMUNE GLOBULIN 1500 UNIT/2ML IJ SOSY
300.0000 ug | PREFILLED_SYRINGE | Freq: Once | INTRAMUSCULAR | Status: AC
  Administered 2017-07-09: 300 ug via INTRAMUSCULAR

## 2017-07-09 NOTE — Progress Notes (Signed)
Tdap given today,pt states vagina was swollen last week but it went down, also having d/c concerned of a yeast infection.

## 2017-07-09 NOTE — Progress Notes (Signed)
   PRENATAL VISIT NOTE  Subjective:  Angela Andrade is a 37 y.o. G4P3003 at 897w6d being seen today for ongoing prenatal care.  She is currently monitored for the following issues for this high-risk pregnancy and has Mood disorder (HCC); Bipolar I disorder, most recent episode mixed (HCC); Supervision of high risk pregnancy, antepartum; Morning sickness; Advanced maternal age in multigravida, first trimester; Hereditary disease in family possibly affecting fetus, affecting management of mother, antepartum condition or complication, not applicable or unspecified fetus; Rh negative, antepartum; Anemia, antepartum; Beta thalassemia minor; and Trichomoniasis on their problem list.  Patient reports vaginal irritation.  Contractions: Irritability. Vag. Bleeding: None.  Movement: Present. Denies leaking of fluid.   The following portions of the patient's history were reviewed and updated as appropriate: allergies, current medications, past family history, past medical history, past social history, past surgical history and problem list. Problem list updated.  Objective:   Vitals:   07/09/17 0900  BP: 116/64  Pulse: 79  Weight: 164 lb 9.6 oz (74.7 kg)    Fetal Status: Fetal Heart Rate (bpm): 151 Fundal Height: 28 cm Movement: Present     General:  Alert, oriented and cooperative. Patient is in no acute distress.  Skin: Skin is warm and dry. No rash noted.   Cardiovascular: Normal heart rate noted  Respiratory: Normal respiratory effort, no problems with respiration noted  Abdomen: Soft, gravid, appropriate for gestational age.  Pain/Pressure: Present     Pelvic: Cervical exam deferred        Extremities: Normal range of motion.  Edema: None  Mental Status:  Normal mood and affect. Normal behavior. Normal judgment and thought content.   Assessment and Plan:  Pregnancy: G4P3003 at 127w6d  1. Supervision of high risk pregnancy, antepartum FHT and FH - Glucose Tolerance, 2 Hours w/1 Hour -  RPR - HIV antibody - CBC - Cervicovaginal ancillary only  2. Advanced maternal age in multigravida, first trimester Normal NIPS  3. Rh negative, antepartum Rhogam today  4. Beta thalassemia minor CBC today  5. Bipolar I disorder, most recent episode mixed (HCC)   Preterm labor symptoms and general obstetric precautions including but not limited to vaginal bleeding, contractions, leaking of fluid and fetal movement were reviewed in detail with the patient. Please refer to After Visit Summary for other counseling recommendations.  No Follow-up on file.   Levie HeritageJacob J Claudetta Sallie, DO

## 2017-07-09 NOTE — Addendum Note (Signed)
Addended by: Mikey BussingWILSON, CHIQUITA L on: 07/09/2017 09:42 AM   Modules accepted: Orders

## 2017-07-10 ENCOUNTER — Encounter: Payer: Self-pay | Admitting: Family Medicine

## 2017-07-10 LAB — CBC
Hematocrit: 30.4 % — ABNORMAL LOW (ref 34.0–46.6)
Hemoglobin: 9.7 g/dL — ABNORMAL LOW (ref 11.1–15.9)
MCH: 24.4 pg — AB (ref 26.6–33.0)
MCHC: 31.9 g/dL (ref 31.5–35.7)
MCV: 77 fL — ABNORMAL LOW (ref 79–97)
PLATELETS: 266 10*3/uL (ref 150–379)
RBC: 3.97 x10E6/uL (ref 3.77–5.28)
RDW: 14.8 % (ref 12.3–15.4)
WBC: 8.1 10*3/uL (ref 3.4–10.8)

## 2017-07-10 LAB — CERVICOVAGINAL ANCILLARY ONLY
BACTERIAL VAGINITIS: POSITIVE — AB
CHLAMYDIA, DNA PROBE: NEGATIVE
Candida vaginitis: NEGATIVE
Neisseria Gonorrhea: NEGATIVE
Trichomonas: POSITIVE — AB

## 2017-07-10 LAB — HIV ANTIBODY (ROUTINE TESTING W REFLEX): HIV Screen 4th Generation wRfx: NONREACTIVE

## 2017-07-10 LAB — GLUCOSE TOLERANCE, 2 HOURS W/ 1HR
GLUCOSE, 1 HOUR: 149 mg/dL (ref 65–179)
GLUCOSE, 2 HOUR: 79 mg/dL (ref 65–152)
Glucose, Fasting: 77 mg/dL (ref 65–91)

## 2017-07-10 LAB — RPR: RPR: NONREACTIVE

## 2017-07-12 ENCOUNTER — Other Ambulatory Visit: Payer: Self-pay | Admitting: Family Medicine

## 2017-07-12 MED ORDER — METRONIDAZOLE 500 MG PO TABS: 500.0000 mg | ORAL_TABLET | Freq: Two times a day (BID) | ORAL | 0 refills | Status: DC

## 2017-07-26 ENCOUNTER — Encounter: Payer: Self-pay | Admitting: Obstetrics and Gynecology

## 2017-07-26 ENCOUNTER — Other Ambulatory Visit (HOSPITAL_COMMUNITY)
Admission: RE | Admit: 2017-07-26 | Discharge: 2017-07-26 | Disposition: A | Discharge status: Home / Self Care | Payer: Medicare Other | Source: Ambulatory Visit | Attending: Obstetrics and Gynecology | Admitting: Obstetrics and Gynecology

## 2017-07-26 ENCOUNTER — Ambulatory Visit (INDEPENDENT_AMBULATORY_CARE_PROVIDER_SITE_OTHER): Payer: Medicare Other | Admitting: Obstetrics and Gynecology

## 2017-07-26 VITALS — BP 124/74 | HR 82 | Wt 171.3 lb

## 2017-07-26 DIAGNOSIS — O98813 Other maternal infectious and parasitic diseases complicating pregnancy, third trimester: Secondary | ICD-10-CM | POA: Insufficient documentation

## 2017-07-26 DIAGNOSIS — O09521 Supervision of elderly multigravida, first trimester: Secondary | ICD-10-CM | POA: Diagnosis not present

## 2017-07-26 DIAGNOSIS — A599 Trichomoniasis, unspecified: Secondary | ICD-10-CM | POA: Insufficient documentation

## 2017-07-26 DIAGNOSIS — D563 Thalassemia minor: Secondary | ICD-10-CM | POA: Diagnosis not present

## 2017-07-26 DIAGNOSIS — Z6791 Unspecified blood type, Rh negative: Secondary | ICD-10-CM | POA: Insufficient documentation

## 2017-07-26 DIAGNOSIS — O26899 Other specified pregnancy related conditions, unspecified trimester: Secondary | ICD-10-CM

## 2017-07-26 DIAGNOSIS — O09899 Supervision of other high risk pregnancies, unspecified trimester: Secondary | ICD-10-CM

## 2017-07-26 DIAGNOSIS — Z3A32 32 weeks gestation of pregnancy: Secondary | ICD-10-CM | POA: Diagnosis not present

## 2017-07-26 DIAGNOSIS — O0993 Supervision of high risk pregnancy, unspecified, third trimester: Secondary | ICD-10-CM | POA: Insufficient documentation

## 2017-07-26 DIAGNOSIS — O0990 Supervision of high risk pregnancy, unspecified, unspecified trimester: Secondary | ICD-10-CM

## 2017-07-26 NOTE — Progress Notes (Signed)
Pt states hips lock constatntly in  A lot of pain, wants to see if she can be Induced

## 2017-07-26 NOTE — Progress Notes (Signed)
Subjective:  Angela Andrade is a 37 y.o. G4P3003 at [redacted]w[redacted]d being seen today for ongoing prenatal care.  She is currently monitored for the following issues for this high-risk pregnancy and has Mood disorder (HCC); Bipolar I disorder, most recent episode mixed (HCC); Supervision of high risk pregnancy, antepartum; Morning sickness; Advanced maternal age in multigravida, first trimester; Hereditary disease in family possibly affecting fetus, affecting management of mother, antepartum condition or complication, not applicable or unspecified fetus; Rh negative, antepartum; Anemia, antepartum; Beta thalassemia minor; and Trichomoniasis on their problem list.  Patient reports general discomforts of pregnancy.  Contractions: Irritability. Vag. Bleeding: None.  Movement: Present. Denies leaking of fluid.   The following portions of the patient's history were reviewed and updated as appropriate: allergies, current medications, past family history, past medical history, past social history, past surgical history and problem list. Problem list updated.  Objective:   Vitals:   07/26/17 1316  BP: 124/74  Pulse: 82  Weight: 171 lb 4.8 oz (77.7 kg)    Fetal Status: Fetal Heart Rate (bpm): 150   Movement: Present     General:  Alert, oriented and cooperative. Patient is in no acute distress.  Skin: Skin is warm and dry. No rash noted.   Cardiovascular: Normal heart rate noted  Respiratory: Normal respiratory effort, no problems with respiration noted  Abdomen: Soft, gravid, appropriate for gestational age. Pain/Pressure: Present     Pelvic:  Cervical exam deferred        Extremities: Normal range of motion.  Edema: None  Mental Status: Normal mood and affect. Normal behavior. Normal judgment and thought content.   Urinalysis:      Assessment and Plan:  Pregnancy: G4P3003 at [redacted]w[redacted]d  1. Trichomoniasis  - Cervicovaginal ancillary only  2. Supervision of high risk pregnancy, antepartum Stable  3. Rh  negative, antepartum S/P Rhogam at last visit  4. Beta thalassemia minor CBC checked at last visit  5. Advanced maternal age in multigravida, first trimester Nl NIPS  Preterm labor symptoms and general obstetric precautions including but not limited to vaginal bleeding, contractions, leaking of fluid and fetal movement were reviewed in detail with the patient. Please refer to After Visit Summary for other counseling recommendations.  Return in about 2 weeks (around 08/09/2017) for OB visit.   Hermina StaggersErvin, Tiena Manansala L, MD

## 2017-07-27 LAB — CERVICOVAGINAL ANCILLARY ONLY: TRICH (WINDOWPATH): NEGATIVE

## 2017-07-30 ENCOUNTER — Encounter: Payer: Self-pay | Admitting: Family Medicine

## 2017-08-09 ENCOUNTER — Encounter: Payer: Self-pay | Admitting: Obstetrics & Gynecology

## 2017-08-09 ENCOUNTER — Ambulatory Visit (INDEPENDENT_AMBULATORY_CARE_PROVIDER_SITE_OTHER): Payer: Medicare Other | Admitting: Obstetrics & Gynecology

## 2017-08-09 VITALS — BP 127/67 | HR 85 | Wt 174.3 lb

## 2017-08-09 DIAGNOSIS — O0990 Supervision of high risk pregnancy, unspecified, unspecified trimester: Secondary | ICD-10-CM

## 2017-08-09 DIAGNOSIS — O09521 Supervision of elderly multigravida, first trimester: Secondary | ICD-10-CM

## 2017-08-09 NOTE — Patient Instructions (Signed)

## 2017-08-09 NOTE — Progress Notes (Signed)
   PRENATAL VISIT NOTE  Subjective:  Angela Andrade is a 37 y.o. G4P3003 at 6374w2d being seen today for ongoing prenatal care.  She is currently monitored for the following issues for this high-risk pregnancy and has Mood disorder (HCC); Bipolar I disorder, most recent episode mixed (HCC); Supervision of high risk pregnancy, antepartum; Morning sickness; Advanced maternal age in multigravida, first trimester; Hereditary disease in family possibly affecting fetus, affecting management of mother, antepartum condition or complication, not applicable or unspecified fetus; Rh negative, antepartum; Anemia, antepartum; Beta thalassemia minor; and Trichomoniasis on their problem list.  Patient reports occasional contractions.  Contractions: Irritability. Vag. Bleeding: None.  Movement: Present. Denies leaking of fluid.   The following portions of the patient's history were reviewed and updated as appropriate: allergies, current medications, past family history, past medical history, past social history, past surgical history and problem list. Problem list updated.  Objective:   Vitals:   08/09/17 1022  BP: 127/67  Pulse: 85  Weight: 174 lb 4.8 oz (79.1 kg)    Fetal Status: Fetal Heart Rate (bpm): 149   Movement: Present     General:  Alert, oriented and cooperative. Patient is in no acute distress.  Skin: Skin is warm and dry. No rash noted.   Cardiovascular: Normal heart rate noted  Respiratory: Normal respiratory effort, no problems with respiration noted  Abdomen: Soft, gravid, appropriate for gestational age.  Pain/Pressure: Present     Pelvic: Cervical exam deferred        Extremities: Normal range of motion.  Edema: None  Mental Status: Normal mood and affect. Normal behavior. Normal judgment and thought content.   Assessment and Plan:  Pregnancy: G4P3003 at 2574w2d  1. Supervision of high risk pregnancy, antepartum AMA  2. [facility] maternal [facility] multigravida, [facility]  Preterm labor symptoms and general obstetric precautions including but not limited to vaginal bleeding, contractions, leaking of fluid and fetal movement were reviewed in detail with the patient. Please refer to After Visit Summary for other counseling recommendations.  Return in about 2 weeks (around 08/23/2017).  No future appointments.  Scheryl DarterJames Ravi Tuccillo, MD

## 2017-08-21 ENCOUNTER — Telehealth: Payer: Self-pay

## 2017-08-21 NOTE — Telephone Encounter (Signed)
Pt called stating that she is having nausea, vomiting, and diarrhea.  Called pt and pt informed me that her sx has lessen but she is having some itching.  I advised to the pt that she take Imodium for the diarrhea, Vitamin B6 100mg  po bid for the nausea unless she has already have something that she had from earlier in her pregnancy.  I also stated that Benadryl as instructed on the medication for the itching.  I informed pt that if her diarrhea and vomiting worsens to please go to MAU because she could possibly become dehydrated and if she doesn't to continue until she is evaluated on next OB appt scheduled for 08/29/17.  Pt stated thank you with no further questions.

## 2017-08-29 ENCOUNTER — Inpatient Hospital Stay (HOSPITAL_COMMUNITY): Payer: Medicare Other | Admitting: Anesthesiology

## 2017-08-29 ENCOUNTER — Other Ambulatory Visit (HOSPITAL_COMMUNITY)
Admission: RE | Admit: 2017-08-29 | Discharge: 2017-08-29 | Disposition: A | Discharge status: Home / Self Care | Payer: Medicare Other | Source: Ambulatory Visit | Attending: Obstetrics and Gynecology | Admitting: Obstetrics and Gynecology

## 2017-08-29 ENCOUNTER — Encounter: Payer: Self-pay | Admitting: Obstetrics and Gynecology

## 2017-08-29 ENCOUNTER — Ambulatory Visit (INDEPENDENT_AMBULATORY_CARE_PROVIDER_SITE_OTHER): Payer: Medicare Other | Admitting: Obstetrics and Gynecology

## 2017-08-29 ENCOUNTER — Inpatient Hospital Stay (HOSPITAL_COMMUNITY)
Admission: AD | Admit: 2017-08-29 | Discharge: 2017-08-31 | DRG: 807 | Disposition: A | Discharge status: Home / Self Care | Payer: Medicare Other | Source: Ambulatory Visit | Attending: Obstetrics & Gynecology | Admitting: Obstetrics & Gynecology

## 2017-08-29 ENCOUNTER — Encounter (HOSPITAL_COMMUNITY): Payer: Self-pay

## 2017-08-29 VITALS — BP 115/79 | HR 84 | Wt 176.5 lb

## 2017-08-29 DIAGNOSIS — O0990 Supervision of high risk pregnancy, unspecified, unspecified trimester: Secondary | ICD-10-CM | POA: Insufficient documentation

## 2017-08-29 DIAGNOSIS — O99019 Anemia complicating pregnancy, unspecified trimester: Secondary | ICD-10-CM

## 2017-08-29 DIAGNOSIS — O26899 Other specified pregnancy related conditions, unspecified trimester: Secondary | ICD-10-CM

## 2017-08-29 DIAGNOSIS — Z6791 Unspecified blood type, Rh negative: Secondary | ICD-10-CM

## 2017-08-29 DIAGNOSIS — Z3A37 37 weeks gestation of pregnancy: Secondary | ICD-10-CM | POA: Diagnosis not present

## 2017-08-29 DIAGNOSIS — Z87891 Personal history of nicotine dependence: Secondary | ICD-10-CM | POA: Diagnosis not present

## 2017-08-29 DIAGNOSIS — D563 Thalassemia minor: Secondary | ICD-10-CM

## 2017-08-29 DIAGNOSIS — O26893 Other specified pregnancy related conditions, third trimester: Secondary | ICD-10-CM | POA: Diagnosis present

## 2017-08-29 DIAGNOSIS — O99824 Streptococcus B carrier state complicating childbirth: Secondary | ICD-10-CM | POA: Diagnosis present

## 2017-08-29 DIAGNOSIS — O0993 Supervision of high risk pregnancy, unspecified, third trimester: Secondary | ICD-10-CM

## 2017-08-29 DIAGNOSIS — O210 Mild hyperemesis gravidarum: Secondary | ICD-10-CM

## 2017-08-29 DIAGNOSIS — Z3483 Encounter for supervision of other normal pregnancy, third trimester: Secondary | ICD-10-CM | POA: Diagnosis present

## 2017-08-29 HISTORY — DX: Encounter for other specified aftercare: Z51.89

## 2017-08-29 LAB — CBC
HCT: 33.2 % — ABNORMAL LOW (ref 36.0–46.0)
Hemoglobin: 10.5 g/dL — ABNORMAL LOW (ref 12.0–15.0)
MCH: 23.8 pg — AB (ref 26.0–34.0)
MCHC: 31.6 g/dL (ref 30.0–36.0)
MCV: 75.3 fL — ABNORMAL LOW (ref 78.0–100.0)
PLATELETS: 330 10*3/uL (ref 150–400)
RBC: 4.41 MIL/uL (ref 3.87–5.11)
RDW: 14.5 % (ref 11.5–15.5)
WBC: 12.6 10*3/uL — ABNORMAL HIGH (ref 4.0–10.5)

## 2017-08-29 LAB — GROUP B STREP BY PCR: Group B strep by PCR: POSITIVE — AB

## 2017-08-29 MED ORDER — EPHEDRINE 5 MG/ML INJ
10.0000 mg | INTRAVENOUS | Status: DC | PRN
Start: 2017-08-29 — End: 2017-08-29
  Filled 2017-08-29: qty 2

## 2017-08-29 MED ORDER — LACTATED RINGERS IV SOLN
500.0000 mL | INTRAVENOUS | Status: DC | PRN
  Administered 2017-08-29: 500 mL via INTRAVENOUS

## 2017-08-29 MED ORDER — PRENATAL MULTIVITAMIN CH
1.0000 | ORAL_TABLET | Freq: Every day | ORAL | Status: DC
  Administered 2017-08-30 – 2017-08-31 (×2): 1 via ORAL
  Filled 2017-08-29 (×2): qty 1

## 2017-08-29 MED ORDER — SIMETHICONE 80 MG PO CHEW: 80.0000 mg | CHEWABLE_TABLET | ORAL | Status: DC | PRN

## 2017-08-29 MED ORDER — LACTATED RINGERS IV SOLN
INTRAVENOUS | Status: DC
  Administered 2017-08-29: 14:00:00 via INTRAVENOUS

## 2017-08-29 MED ORDER — TETANUS-DIPHTH-ACELL PERTUSSIS 5-2.5-18.5 LF-MCG/0.5 IM SUSP: 0.5000 mL | Freq: Once | INTRAMUSCULAR | Status: DC

## 2017-08-29 MED ORDER — DIBUCAINE 1 % RE OINT: 1.0000 "application " | TOPICAL_OINTMENT | RECTAL | Status: DC | PRN

## 2017-08-29 MED ORDER — LIDOCAINE HCL (PF) 1 % IJ SOLN
INTRAMUSCULAR | Status: DC | PRN
  Administered 2017-08-29: 13 mL via EPIDURAL

## 2017-08-29 MED ORDER — FENTANYL CITRATE (PF) 100 MCG/2ML IJ SOLN
100.0000 ug | INTRAMUSCULAR | Status: DC | PRN
  Administered 2017-08-29: 100 ug via INTRAVENOUS
  Filled 2017-08-29: qty 2

## 2017-08-29 MED ORDER — IBUPROFEN 600 MG PO TABS
600.0000 mg | ORAL_TABLET | Freq: Four times a day (QID) | ORAL | Status: DC
  Administered 2017-08-29 – 2017-08-31 (×8): 600 mg via ORAL
  Filled 2017-08-29 (×8): qty 1

## 2017-08-29 MED ORDER — DIPHENHYDRAMINE HCL 25 MG PO CAPS: 25.0000 mg | ORAL_CAPSULE | Freq: Four times a day (QID) | ORAL | Status: DC | PRN

## 2017-08-29 MED ORDER — PENICILLIN G POTASSIUM 5000000 UNITS IJ SOLR
2.5000 10*6.[IU] | INTRAVENOUS | Status: DC
  Filled 2017-08-29 (×2): qty 2.5

## 2017-08-29 MED ORDER — ONDANSETRON HCL 4 MG/2ML IJ SOLN: 4.0000 mg | Freq: Four times a day (QID) | INTRAMUSCULAR | Status: DC | PRN

## 2017-08-29 MED ORDER — ACETAMINOPHEN 325 MG PO TABS: 650.0000 mg | ORAL_TABLET | ORAL | Status: DC | PRN

## 2017-08-29 MED ORDER — SOD CITRATE-CITRIC ACID 500-334 MG/5ML PO SOLN: 30.0000 mL | ORAL | Status: DC | PRN

## 2017-08-29 MED ORDER — PENICILLIN G POT IN DEXTROSE 60000 UNIT/ML IV SOLN
3.0000 10*6.[IU] | INTRAVENOUS | Status: DC
  Filled 2017-08-29 (×2): qty 50

## 2017-08-29 MED ORDER — FENTANYL 2.5 MCG/ML BUPIVACAINE 1/10 % EPIDURAL INFUSION (WH - ANES)
14.0000 mL/h | INTRAMUSCULAR | Status: DC | PRN
  Administered 2017-08-29: 14 mL/h via EPIDURAL
  Filled 2017-08-29: qty 100

## 2017-08-29 MED ORDER — SODIUM CHLORIDE 0.9 % IV SOLN
5.0000 10*6.[IU] | Freq: Once | INTRAVENOUS | Status: AC
  Administered 2017-08-29: 5 10*6.[IU] via INTRAVENOUS
  Filled 2017-08-29: qty 5

## 2017-08-29 MED ORDER — COCONUT OIL OIL
1.0000 "application " | TOPICAL_OIL | Status: DC | PRN
  Administered 2017-08-30: 1 via TOPICAL
  Filled 2017-08-29: qty 120

## 2017-08-29 MED ORDER — LIDOCAINE HCL (PF) 1 % IJ SOLN
30.0000 mL | INTRAMUSCULAR | Status: DC | PRN
  Filled 2017-08-29: qty 30

## 2017-08-29 MED ORDER — OXYTOCIN 40 UNITS IN LACTATED RINGERS INFUSION - SIMPLE MED
2.5000 [IU]/h | INTRAVENOUS | Status: DC
  Administered 2017-08-29: 2.5 [IU]/h via INTRAVENOUS
  Filled 2017-08-29: qty 1000

## 2017-08-29 MED ORDER — EPHEDRINE 5 MG/ML INJ
10.0000 mg | INTRAVENOUS | Status: DC | PRN
  Filled 2017-08-29: qty 2

## 2017-08-29 MED ORDER — ONDANSETRON HCL 4 MG PO TABS: 4.0000 mg | ORAL_TABLET | ORAL | Status: DC | PRN

## 2017-08-29 MED ORDER — OXYTOCIN BOLUS FROM INFUSION
500.0000 mL | Freq: Once | INTRAVENOUS | Status: AC
  Administered 2017-08-29: 500 mL via INTRAVENOUS

## 2017-08-29 MED ORDER — PHENYLEPHRINE 40 MCG/ML (10ML) SYRINGE FOR IV PUSH (FOR BLOOD PRESSURE SUPPORT)
80.0000 ug | PREFILLED_SYRINGE | INTRAVENOUS | Status: DC | PRN
  Filled 2017-08-29: qty 5
  Filled 2017-08-29: qty 10

## 2017-08-29 MED ORDER — ONDANSETRON HCL 4 MG/2ML IJ SOLN: 4.0000 mg | INTRAMUSCULAR | Status: DC | PRN

## 2017-08-29 MED ORDER — SENNOSIDES-DOCUSATE SODIUM 8.6-50 MG PO TABS
2.0000 | ORAL_TABLET | ORAL | Status: DC
  Administered 2017-08-30 – 2017-08-31 (×2): 2 via ORAL
  Filled 2017-08-29 (×2): qty 2

## 2017-08-29 MED ORDER — LACTATED RINGERS IV SOLN
500.0000 mL | Freq: Once | INTRAVENOUS | Status: AC
  Administered 2017-08-29: 500 mL via INTRAVENOUS

## 2017-08-29 MED ORDER — BENZOCAINE-MENTHOL 20-0.5 % EX AERO: 1.0000 "application " | INHALATION_SPRAY | CUTANEOUS | Status: DC | PRN

## 2017-08-29 MED ORDER — DIPHENHYDRAMINE HCL 50 MG/ML IJ SOLN: 12.5000 mg | INTRAMUSCULAR | Status: DC | PRN

## 2017-08-29 MED ORDER — ZOLPIDEM TARTRATE 5 MG PO TABS: 5.0000 mg | ORAL_TABLET | Freq: Every evening | ORAL | Status: DC | PRN

## 2017-08-29 MED ORDER — ACETAMINOPHEN 325 MG PO TABS
650.0000 mg | ORAL_TABLET | ORAL | Status: DC | PRN
  Administered 2017-08-29 – 2017-08-31 (×7): 650 mg via ORAL
  Filled 2017-08-29 (×6): qty 2

## 2017-08-29 MED ORDER — WITCH HAZEL-GLYCERIN EX PADS: 1.0000 "application " | MEDICATED_PAD | CUTANEOUS | Status: DC | PRN

## 2017-08-29 MED ORDER — PHENYLEPHRINE 40 MCG/ML (10ML) SYRINGE FOR IV PUSH (FOR BLOOD PRESSURE SUPPORT)
80.0000 ug | PREFILLED_SYRINGE | INTRAVENOUS | Status: AC | PRN
  Administered 2017-08-29 (×3): 80 ug via INTRAVENOUS

## 2017-08-29 NOTE — Progress Notes (Signed)
Angela Andrade is a 37 y.o. G4P3003 at [redacted]w[redacted]d by LMP admitted for active labor  Subjective: Pt crying, uncomfortable with contractions, desires epidural.  Friend/support person at bedside.   Objective: BP 121/75   Pulse 94   Temp 98.8 F (37.1 C) (Axillary)   Resp 19   LMP 12/22/2016 Comment: negative preg test tonight No intake/output data recorded. No intake/output data recorded.  FHT:  FHR: 135 bpm, variability: moderate,  accelerations:  Present,  decelerations:  Present early UC:   regular, every 2-3 minutes SVE:   Dilation: 6.5 Effacement (%): 100 Station: -3 Exam by:: [employer] rnc  Labs: Lab Results  Component Value Date   WBC 12.6 (H) 08/29/2017   HGB 10.5 (L) 08/29/2017   HCT 33.2 (L) 08/29/2017   MCV 75.3 (L) 08/29/2017   PLT 330 08/29/2017    Assessment / Plan: Spontaneous labor, progressing normally  Labor: Progressing normally. Pt to get epidural then reevaluate, consider AROM. Preeclampsia:  n/a Fetal Wellbeing:  Category I Pain Control:  IV pain meds I/D:  GBS neg Anticipated MOD:  NSVD  Sharen Counter 08/29/2017, 4:57 PM

## 2017-08-29 NOTE — Anesthesia Preprocedure Evaluation (Signed)
Anesthesia Evaluation  Patient identified by MRN, date of birth, ID band Patient awake    Reviewed: Allergy & Precautions, NPO status , Patient's Chart, lab work & pertinent test results  Airway Mallampati: II  TM Distance: >3 FB Neck ROM: Full    Dental no notable dental hx.    Pulmonary neg pulmonary ROS, former smoker,    Pulmonary exam normal breath sounds clear to auscultation       Cardiovascular negative cardio ROS Normal cardiovascular exam Rhythm:Regular Rate:Normal     Neuro/Psych negative neurological ROS  negative psych ROS   GI/Hepatic negative GI ROS, Neg liver ROS,   Endo/Other  negative endocrine ROS  Renal/GU negative Renal ROS  negative genitourinary   Musculoskeletal negative musculoskeletal ROS (+)   Abdominal   Peds negative pediatric ROS (+)  Hematology negative hematology ROS (+)   Anesthesia Other Findings   Reproductive/Obstetrics negative OB ROS (+) Pregnancy                             Anesthesia Physical Anesthesia Plan  ASA: II  Anesthesia Plan: Epidural   Post-op Pain Management:    Induction:   PONV Risk Score and Plan:   Airway Management Planned:   Additional Equipment:   Intra-op Plan:   Post-operative Plan:   Informed Consent:   Plan Discussed with:   Anesthesia Plan Comments:         Anesthesia Quick Evaluation  

## 2017-08-29 NOTE — Anesthesia Procedure Notes (Signed)
Epidural Patient location during procedure: OB Start time: 08/29/2017 5:04 PM End time: 08/29/2017 5:21 PM  Staffing Anesthesiologist: Lowella Curb, MD Performed: anesthesiologist   Preanesthetic Checklist Completed: patient identified, site marked, surgical consent, pre-op evaluation, timeout performed, IV checked, risks and benefits discussed and monitors and equipment checked  Epidural Patient position: sitting Prep: ChloraPrep Patient monitoring: heart rate, cardiac monitor, continuous pulse ox and blood pressure Approach: midline Location: L2-L3 Injection technique: LOR saline  Needle:  Needle type: Tuohy  Needle gauge: 17 G Needle length: 9 cm Needle insertion depth: 6 cm Catheter type: closed end flexible Catheter size: 20 Guage Catheter at skin depth: 10 cm Test dose: negative  Assessment Events: blood not aspirated, injection not painful, no injection resistance, negative IV test and no paresthesia  Additional Notes Reason for block:procedure for pain

## 2017-08-29 NOTE — Progress Notes (Signed)
CRITICAL VALUE STICKER  CRITICAL VALUE: GBS Pos  RECEIVER (on-site recipient of call): Claudette Laws rnc  DATE & TIME NOTIFIED: 08/29/17 1447  MESSENGER (representative from lab):  MD NOTIFIED: Sharen Counter TIME OF NOTIFICATION: 1453  RESPONSE: will order PCN

## 2017-08-29 NOTE — MAU Note (Signed)
Pt is a G4P3 at 37.1 ctx q 2-4 min.  H/o rapid delivery.  No LOF, no VB, positive FM.  No other OB concerns.

## 2017-08-29 NOTE — Progress Notes (Signed)
   PRENATAL VISIT NOTE  Subjective:  Angela Andrade is a 37 y.o. G4P3003 at [redacted]w[redacted]d being seen today for ongoing prenatal care.  She is currently monitored for the following issues for this low-risk pregnancy and has Mood disorder (HCC); Bipolar I disorder, most recent episode mixed (HCC); Supervision of high risk pregnancy, antepartum; Morning sickness; Advanced maternal age in multigravida, first trimester; Hereditary disease in family possibly affecting fetus, affecting management of mother, antepartum condition or complication, not applicable or unspecified fetus; Rh negative, antepartum; Anemia, antepartum; Beta thalassemia minor; and Trichomoniasis on their problem list.  Patient reports regular contractions every 8 mintues, uncomfortable with them.  Contractions: Regular.  .   . Denies leaking of fluid. Denies bleeding, reports normal fetal movement.  The following portions of the patient's history were reviewed and updated as appropriate: allergies, current medications, past family history, past medical history, past social history, past surgical history and problem list. Problem list updated.  Objective:   Vitals:   08/29/17 1006  BP: 115/79  Pulse: 84  Weight: 176 lb 8 oz (80.1 kg)    Fetal Status: Fetal Heart Rate (bpm): 150         General:  Alert, oriented and cooperative. Patient is in no acute distress.  Skin: Skin is warm and dry. No rash noted.   Cardiovascular: Normal heart rate noted  Respiratory: Normal respiratory effort, no problems with respiration noted  Abdomen: Soft, gravid, appropriate for gestational age.  Pain/Pressure: Present     Pelvic: Cervical exam performed      2/20/-1, very soft, anterior  Extremities: Normal range of motion.  Edema: Trace  Mental Status: Normal mood and affect. Normal behavior. Normal judgment and thought content.   Assessment and Plan:  Pregnancy: G4P3003 at [redacted]w[redacted]d  1. Supervision of high risk pregnancy, antepartum  2. Beta  thalassemia minor  3. Rh negative, antepartum S/op rho gam 28 weeks  Patient contracting regularly, cervix 2 cm, reviewed reasons to present to MAU, esp as last delivery was in MAU. She will come in with any worsening of contractions, leaking, bleeding.  Preterm labor symptoms and general obstetric precautions including but not limited to vaginal bleeding, contractions, leaking of fluid and fetal movement were reviewed in detail with the patient. Please refer to After Visit Summary for other counseling recommendations.  Return in about 1 week (around 09/05/2017) for OB visit.  No future appointments.  Conan Bowens, MD
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
## 2017-08-29 NOTE — Anesthesia Pain Management Evaluation Note (Signed)
  CRNA Pain Management Visit Note  Patient: Angela Andrade, 37 y.o., female  "Hello I am a member of the anesthesia team at [facility]. We have an anesthesia team available at all times to provide care throughout the hospital, including epidural management and anesthesia for C-section. I don't know your plan for the delivery whether it a natural birth, water birth, IV sedation, nitrous supplementation, doula or epidural, but we want to meet your pain goals."   1.Was your pain managed to your expectations on prior hospitalizations?   Yes   2.What is your expectation for pain management during this hospitalization?     Epidural  3.How can we help you reach that goal?   Record the patient's initial score and the patient's pain goal.   Pain: 7  Pain Goal: 5 [facility] wants you to be able to say your pain was always managed very well.  Laban Emperor 08/29/2017

## 2017-08-30 LAB — GC/CHLAMYDIA PROBE AMP (~~LOC~~) NOT AT ARMC
CHLAMYDIA, DNA PROBE: NEGATIVE
NEISSERIA GONORRHEA: NEGATIVE

## 2017-08-30 LAB — RPR: RPR Ser Ql: NONREACTIVE

## 2017-08-30 MED ORDER — RHO D IMMUNE GLOBULIN 1500 UNIT/2ML IJ SOSY
300.0000 ug | PREFILLED_SYRINGE | Freq: Once | INTRAMUSCULAR | Status: AC
  Administered 2017-08-30: 300 ug via INTRAVENOUS
  Filled 2017-08-30: qty 2

## 2017-08-30 NOTE — Progress Notes (Signed)
POSTPARTUM PROGRESS NOTE  Post Partum Day 1  Subjective:  Angela Andrade is a 37 y.o. [id number] [redacted]w[redacted]d s/p SVD after SOL. History is further notable for beta thalassemia minor, and Rh negative status.   No acute events overnight.  Pt denies problems with ambulating, voiding or po intake.  She denies nausea or vomiting.  Pain is well controlled.  She has had flatus. She has not had bowel movement.  Lochia Small.   Objective: Blood pressure 125/64, pulse 66, temperature 98.6 F (37 C), temperature source Oral, resp. rate 18, last menstrual period 12/22/2016, SpO2 99 %, unknown if currently breastfeeding.  Physical Exam:  General: alert, cooperative and no distress Lochia:normal flow Chest: CTAB Heart: RRR no m/r/g Abdomen: +BS, soft, nontender,  Uterine Fundus: firm, palpable just inferior to the umbilicus DVT Evaluation: No calf swelling or tenderness Extremities: trace bilateral lower  edema  Recent Labs    08/29/17 1330  HGB 10.5*  HCT 33.2*    Assessment/Plan:  ASSESSMENT: Angela Andrade is a 37 y.o. A2Z3086 [redacted]w[redacted]d s/p NSVD at 37+1 weeks.   Newborn with A+ blood type. Will require Rhogam prior to discharge Continue to monitr Breast feeding well Planning on POPs for contraception Patient is GBS + and did not receive adequate treatment.  Plan for newborn to stay x 48hrs to allow observation  Plan for discharge tomorrow   LOS: 1 day   Gorden Harms, MD PGY-3 08/30/2017, 7:50 AM

## 2017-08-30 NOTE — Anesthesia Postprocedure Evaluation (Signed)
Anesthesia Post Note  Patient: Angela Andrade  Procedure(s) Performed: AN AD HOC LABOR EPIDURAL     Patient location during evaluation: Mother Baby Anesthesia Type: Epidural Level of consciousness: awake Pain management: satisfactory to patient Vital Signs Assessment: post-procedure vital signs reviewed and stable Respiratory status: spontaneous breathing Cardiovascular status: stable Anesthetic complications: no    Last Vitals:  Vitals:   08/29/17 2125 08/30/17 0100  BP: 128/65 125/64  Pulse: 66 66  Resp: 18 18  Temp: 36.9 C 37 C  SpO2:      Last Pain:  Vitals:   08/30/17 0230  TempSrc:   PainSc: 4    Pain Goal:                 [employer]
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
## 2017-08-30 NOTE — Lactation Note (Addendum)
This note was copied from a baby's chart. Lactation Consultation Note  Patient Name: Angela Andrade ZOXWR'U Date: 08/30/2017 Reason for consult: Initial assessment;Early term 37-38.6wks   Initial assessment with mom of 22 hour old infant. Infant STS with mom and feeding when LC entered room. Infant was shallow and nipple was slightly compressed. Mom repositioned infant and pulled him deeper, pillows added for support. Infant with intermittent suckling bursts and intermittent swallows. Reviewed with mom the importance of keeping infant awake at the breast with feedings, mom reports he is usually active with feedings. Mom reports she uses both breasts with each feeding.   Infant with 10 BF for 10-35 minutes, 1 emesis, and 0 voids and 0 stools since birth. Infant weight 7 pounds 7.4 ounces with a 1% weight loss since birth. LATCH scores 8-9. Discussed with mom that if infant not voiding or stooling by 24 hours that I would recommend she begin pumping with DEBP post BF and supplementing infant with EBM with spoon or syringe. Mom voiced understanding.   Mom with soft compressible breasts and areola and everted nipples. Nipple tissue intact. Enc mom to apply EBM to nipples post BF. Coconut oil given with instructions for use.   Enc mom to latch infant deeper with feedings to decrease pain to nipples and to allow for better milk flow. Assisted mom in latching to the right breast in the football hold with pillow support and helped to flange infant lips, mom reports pain better with this latch. More swallows noted and increased more with breast compressions.   Enc mom to feed infant STS 8-12 x in 24 hours at first feeding cues. Reviewed normalcy of cluster feeding with mom. Reviewed Colostrum, supply and demand and milk coming to volume. Mom able to independently express colostrum easily.   BF Resources handout and LC Brochure given, mom informed of IP/OP Services, BF Support Groups and LC phone #.   Mom  given and manual pump at her request with instructions for assembling, disassembling, and cleaning. Mom is a [facility] client and has spoken with them today.   Mom reports all questions/concerns have been answered. Mom to call out for feeding assistance as needed.   Discussed feeding assessment with Doloris Hall, RN. Advised that if infant not stooling/voiding by 24 hours that DEBP should be set up to pump post BF and offer infant EBM post BF.    Maternal Data Formula Feeding for Exclusion: No Has patient been taught Hand Expression?: Yes Does the patient have breastfeeding experience prior to this delivery?: Yes  Feeding Feeding Type: Breast Fed Length of feed: 35 min  LATCH Score Latch: Grasps breast easily, tongue down, lips flanged, rhythmical sucking.  Audible Swallowing: Spontaneous and intermittent  Type of Nipple: Everted at rest and after stimulation  Comfort (Breast/Nipple): Filling, red/small blisters or bruises, mild/mod discomfort  Hold (Positioning): Assistance needed to correctly position infant at breast and maintain latch.  LATCH Score: 8  Interventions Interventions: Breast feeding basics reviewed;Support pillows;Assisted with latch;Position options;Skin to skin;Expressed milk;Breast compression;Hand express;Coconut oil;Hand pump  Lactation Tools Discussed/Used Tools: Coconut oil WIC Program: Yes Pump Review: Setup, frequency, and cleaning   Consult Status Consult Status: Follow-up Date: 08/31/17 Follow-up type: In-patient    Silas Flood Ardith Test 08/30/2017, 4:22 PM

## 2017-08-30 NOTE — Lactation Note (Signed)
This note was copied from a baby's chart. Lactation Consultation Note  Patient Name: Angela Andrade ZOXWR'U Date: 08/30/2017    [facility] Initial Visit: Mother sleeping; will return later if possible   Angela Andrade 08/30/2017, 4:15 AM

## 2017-08-30 NOTE — Lactation Note (Signed)
This note was copied from a baby's chart. Lactation Consultation Note  Patient Name: Angela Andrade ZOXWR'U Date: 08/30/2017   Initial LC Visit:  Attempted to visit with mother but she is sleeping.  Infant is on back in bassinet sleeping.  Spoke with RN and asked her to call me if mother wakes up with questions/concerns before our shift ends.  No LC specific concerns at this time.                 Kaniah Rizzolo R Miyu Fenderson 08/30/2017, 6:32 AM

## 2017-08-31 LAB — RH IG WORKUP (INCLUDES ABO/RH)
ABO/RH(D): A NEG
Fetal Screen: NEGATIVE
GESTATIONAL AGE(WKS): 37.1
Unit division: 0

## 2017-08-31 MED ORDER — IBUPROFEN 800 MG PO TABS: 800.0000 mg | ORAL_TABLET | Freq: Three times a day (TID) | ORAL | 1 refills | Status: DC | PRN

## 2017-08-31 MED ORDER — NORETHINDRONE 0.35 MG PO TABS: 1.0000 | ORAL_TABLET | Freq: Every day | ORAL | 11 refills | Status: DC

## 2017-08-31 NOTE — Discharge Instructions (Signed)
Postpartum Care After Vaginal Delivery °The period of time right after you deliver your newborn is called the postpartum period. °What kind of medical care will I receive? °· You may continue to receive fluids and medicines through an IV tube inserted into one of your veins. °· If an incision was made near your vagina (episiotomy) or if you had some vaginal tearing during delivery, cold compresses may be placed on your episiotomy or your tear. This helps to reduce pain and swelling. °· You may be given a squirt bottle to use when you go to the bathroom. You may use this until you are comfortable wiping as usual. To use the squirt bottle, follow these steps: °? Before you urinate, fill the squirt bottle with warm water. Do not use hot water. °? After you urinate, while you are sitting on the toilet, use the squirt bottle to rinse the area around your urethra and vaginal opening. This rinses away any urine and blood. °? You may do this instead of wiping. As you start healing, you may use the squirt bottle before wiping yourself. Make sure to wipe gently. °? Fill the squirt bottle with clean water every time you use the bathroom. °· You will be given sanitary pads to wear. °How can I expect to feel? °· You may not feel the need to urinate for several hours after delivery. °· You will have some soreness and pain in your abdomen and vagina. °· If you are breastfeeding, you may have uterine contractions every time you breastfeed for up to several weeks postpartum. Uterine contractions help your uterus return to its normal size. °· It is normal to have vaginal bleeding (lochia) after delivery. The amount and appearance of lochia is often similar to a menstrual period in the first week after delivery. It will gradually decrease over the next few weeks to a dry, yellow-brown discharge. For most women, lochia stops completely by 6-8 weeks after delivery. Vaginal bleeding can vary from woman to woman. °· Within the first few  days after delivery, you may have breast engorgement. This is when your breasts feel heavy, full, and uncomfortable. Your breasts may also throb and feel hard, tightly stretched, warm, and tender. After this occurs, you may have milk leaking from your breasts. Your health care provider can help you relieve discomfort due to breast engorgement. Breast engorgement should go away within a few days. °· You may feel more sad or worried than normal due to hormonal changes after delivery. These feelings should not last more than a few days. If these feelings do not go away after several days, speak with your health care provider. °How should I care for myself? °· Tell your health care provider if you have pain or discomfort. °· Drink enough water to keep your urine clear or pale yellow. °· Wash your hands thoroughly with soap and water for at least 20 seconds after changing your sanitary pads, after using the toilet, and before holding or feeding your baby. °· If you are not breastfeeding, avoid touching your breasts a lot. Doing this can make your breasts produce more milk. °· If you become weak or lightheaded, or you feel like you might faint, ask for help before: °? Getting out of bed. °? Showering. °· Change your sanitary pads frequently. Watch for any changes in your flow, such as a sudden increase in volume, a change in color, the passing of large blood clots. If you pass a blood clot from your vagina, save it   to show to your health care provider. Do not flush blood clots down the toilet without having your health care provider look at them. °· Make sure that all your vaccinations are up to date. This can help protect you and your baby from getting certain diseases. You may need to have immunizations done before you leave the hospital. °· If desired, talk with your health care provider about methods of family planning or birth control (contraception). °How can I start bonding with my baby? °Spending as much time as  possible with your baby is very important. During this time, you and your baby can get to know each other and develop a bond. Having your baby stay with you in your room (rooming in) can give you time to get to know your baby. Rooming in can also help you become comfortable caring for your baby. Breastfeeding can also help you bond with your baby. °How can I plan for returning home with my baby? °· Make sure that you have a car seat installed in your vehicle. °? Your car seat should be checked by a certified car seat installer to make sure that it is installed safely. °? Make sure that your baby fits into the car seat safely. °· Ask your health care provider any questions you have about caring for yourself or your baby. Make sure that you are able to contact your health care provider with any questions after leaving the hospital. °This information is not intended to replace advice given to you by your health care provider. Make sure you discuss any questions you have with your health care provider. °Document Released: 02/12/2007 Document Revised: 09/20/2015 Document Reviewed: 03/22/2015 °Elsevier Interactive Patient Education © 2018 Elsevier Inc. ° °

## 2017-08-31 NOTE — Discharge Summary (Signed)
OB Discharge Summary     Patient Name: Angela Andrade DOB: October 26, 1980 MRN: [id number]  Date of admission: 08/29/2017 Delivering MD: Sharen Counter A   Date of discharge: 08/31/2017  Admitting diagnosis: LABOR Intrauterine pregnancy: [redacted]w[redacted]d     Secondary diagnosis:  Active Problems:   NSVD (normal spontaneous vaginal delivery)   Normal labor  Additional problems: none     Discharge diagnosis: Term Pregnancy Delivered                                                                                                Post partum procedures:  Augmentation: none  Complications: 30 sec SD  Hospital course:  Onset of Labor With Vaginal Delivery     37 y.o. yo [id number] at [redacted]w[redacted]d was admitted in Active Labor on 08/29/2017. Patient had an uncomplicated labor course as follows:  Membrane Rupture Time/Date: 5:49 PM ,08/29/2017   Intrapartum Procedures: Episiotomy: None [1]                                         Lacerations:  None [1]  Patient had a delivery of a Viable infant. 08/29/2017  Information for the patient's newborn:  Keera, Altidor [[id number]]  Delivery Method: Vaginal, Spontaneous(Filed from Delivery Summary)    Pateint had an uncomplicated postpartum course.  She is ambulating, tolerating a regular diet, passing flatus, and urinating well. Patient is discharged home in stable condition on 08/31/17.   Physical exam  Vitals:   08/30/17 0100 08/30/17 0800 08/30/17 1740 08/31/17 0535  BP: 125/64 125/67 136/84 132/85  Pulse: 66 63 69 76  Resp: Temp: 98.6 F (37 C) 98.1 F (36.7 C) 98.3 F (36.8 C) 97.9 F (36.6 C)  TempSrc: Oral Oral Oral Oral  SpO2:  100%     General: alert, cooperative and no distress Lochia: appropriate Uterine Fundus: firm Incision: N/A DVT Evaluation: No evidence of DVT seen on physical exam. Negative Homan's sign. No cords or calf tenderness. Labs: Lab Results  Component Value Date   WBC 12.6 (H) 08/29/2017   HGB 10.5 (L)  08/29/2017   HCT 33.2 (L) 08/29/2017   MCV 75.3 (L) 08/29/2017   PLT 330 08/29/2017   CMP Latest Ref Rng & Units 03/12/2016  Glucose 65 - 99 mg/dL 284(X)  BUN 6 - 20 mg/dL 13  Creatinine 3.24 - 4.01 mg/dL 0.27  Sodium 253 - 664 mmol/L 136  Potassium 3.5 - 5.1 mmol/L 3.7  Chloride 101 - 111 mmol/L 107  CO2 22 - 32 mmol/L 21(L)  Calcium 8.9 - 10.3 mg/dL 9.1  Total Protein 6.0 - 8.3 g/dL -  Total Bilirubin 0.3 - 1.2 mg/dL -  Alkaline Phos 39 - 403 U/L -  AST 0 - 37 U/L -  ALT 0 - 35 U/L -    Discharge instruction: per After Visit Summary and "Baby and Me Booklet".  After visit meds:  Allergies as of 08/31/2017      Reactions  Hydrocodone Itching   Ok to take with benadryl   Morphine And Related Itching   Ok to take with benadryl   Zyprexa [olanzapine] Other (See Comments)   hallucinations      Medication List    TAKE these medications   ibuprofen 800 MG tablet Commonly known as:  ADVIL,MOTRIN Take 1 tablet (800 mg total) by mouth every 8 (eight) hours as needed for cramping.   norethindrone 0.35 MG tablet Commonly known as:  ORTHO MICRONOR Take 1 tablet (0.35 mg total) by mouth daily.       Diet: routine diet  Activity: Advance as tolerated. Pelvic rest for 6 weeks.   Outpatient follow up:4 weeks Follow up Appt:No future appointments. Follow up Visit:No follow-ups on file.  Postpartum contraception: Progesterone only pills  Newborn Data: Live born female  Birth Weight: 7 lb 8.5 oz (3415 g) APGAR: 7, 9  Newborn Delivery   Time head delivered:  08/29/2017 17:58:00 Birth date/time:  08/29/2017 17:59:00 Delivery type:  Vaginal, Spontaneous     Baby Feeding: Breast Disposition:home with mother   08/31/2017 Jacklyn Shell, CNM

## 2017-08-31 NOTE — Lactation Note (Addendum)
This note was copied from a baby's chart. Lactation Consultation Note  Patient Name: Angela Andrade [id number] Date: 08/31/2017   P4, Baby 41 hours old and has not stooled. Mother occasionally supplementing w/ formula. Mom has my # to call for assist w/next feeding if she desires. Mother flat affect.  No acknowledgement that she would call for assistance. Offered to help.  Encouraged her to breastfeed on both breasts per feeding with hand expression before latching.  Mom encouraged to feed baby 8-12 times/24 hours and with feeding cues.  Reviewed engorgement care.       Maternal Data    Feeding Feeding Type: Breast Fed Length of feed: 30 min  LATCH Score Latch: Repeated attempts needed to sustain latch, nipple held in mouth throughout feeding, stimulation needed to elicit sucking reflex.  Audible Swallowing: A few with stimulation  Type of Nipple: Everted at rest and after stimulation  Comfort (Breast/Nipple): Soft / non-tender  Hold (Positioning): Assistance needed to correctly position infant at breast and maintain latch.  LATCH Score: 7  Interventions    Lactation Tools Discussed/Used     Consult Status      Dahlia Byes [facility] 08/31/2017, 11:00 AM

## 2017-09-01 LAB — BPAM RBC
BLOOD PRODUCT EXPIRATION DATE: 201905152359
BLOOD PRODUCT EXPIRATION DATE: 201905162359
UNIT TYPE AND RH: 600
Unit Type and Rh: 600

## 2017-09-01 LAB — TYPE AND SCREEN
ABO/RH(D): A NEG
ANTIBODY SCREEN: POSITIVE
Unit division: 0
Unit division: 0

## 2017-09-01 LAB — CULTURE, BETA STREP (GROUP B ONLY): Strep Gp B Culture: POSITIVE — AB

## 2017-09-05 ENCOUNTER — Encounter: Payer: Self-pay | Admitting: Nurse Practitioner

## 2017-09-14 ENCOUNTER — Telehealth: Payer: Self-pay | Admitting: Obstetrics and Gynecology

## 2017-09-14 MED ORDER — LABETALOL HCL 200 MG PO TABS: 200.0000 mg | ORAL_TABLET | Freq: Two times a day (BID) | ORAL | 1 refills | Status: DC

## 2017-09-14 NOTE — Telephone Encounter (Signed)
Received call from home health RN with patient BP of 146/98. Called patient, she is feeling well, mostly tired. Has intermittent mild headache. No h/o HTN, I advised her that she should start an anti-hypertensive, she is agreeable. Will have office call her for BP check next week, reviewed precautions to present to MAU, she verbalizes understanding. Answered all questions. Labetalol sent to pharmacy.   Baldemar Lenis, M.D. Attending Obstetrician & Gynecologist, [facility] for [employer], [facility]

## 2017-09-20 ENCOUNTER — Ambulatory Visit: Payer: Self-pay

## 2017-09-20 ENCOUNTER — Telehealth: Payer: Self-pay | Admitting: *Deleted

## 2017-09-20 ENCOUNTER — Encounter: Payer: Self-pay | Admitting: *Deleted

## 2017-09-20 NOTE — Telephone Encounter (Signed)
Angela Andrade [facility] appointment for BP check in our office today. I called and left message she missed her nurse visit and to call our office to reschedule asap. Will send letter.

## 2017-10-15 ENCOUNTER — Ambulatory Visit (INDEPENDENT_AMBULATORY_CARE_PROVIDER_SITE_OTHER): Payer: Medicaid Other | Admitting: Obstetrics and Gynecology

## 2017-10-15 ENCOUNTER — Other Ambulatory Visit (HOSPITAL_COMMUNITY)
Admission: RE | Admit: 2017-10-15 | Discharge: 2017-10-15 | Disposition: A | Discharge status: Home / Self Care | Payer: Medicare Other | Source: Ambulatory Visit | Attending: Obstetrics and Gynecology | Admitting: Obstetrics and Gynecology

## 2017-10-15 ENCOUNTER — Encounter: Payer: Self-pay | Admitting: Obstetrics and Gynecology

## 2017-10-15 VITALS — BP 126/78 | HR 82 | Ht 63.0 in | Wt 170.9 lb

## 2017-10-15 DIAGNOSIS — N898 Other specified noninflammatory disorders of vagina: Secondary | ICD-10-CM

## 2017-10-15 DIAGNOSIS — N760 Acute vaginitis: Secondary | ICD-10-CM | POA: Diagnosis not present

## 2017-10-15 DIAGNOSIS — N939 Abnormal uterine and vaginal bleeding, unspecified: Secondary | ICD-10-CM

## 2017-10-15 DIAGNOSIS — B9689 Other specified bacterial agents as the cause of diseases classified elsewhere: Secondary | ICD-10-CM | POA: Insufficient documentation

## 2017-10-15 NOTE — Progress Notes (Signed)
Obstetrics/Postpartum Visit  Appointment Date: 10/15/2017  OBGYN Clinic: [facility]  Primary Care Provider: Ladora Daniel  Chief Complaint:  Chief Complaint  Patient presents with  . Postpartum Care    History of Present Illness: Angela Andrade is a 37 y.o. African-American [phone] (Patient's last menstrual period was 12/22/2016.), seen for the above chief complaint. Her past medical history is significant for n/a   She is s/p SVD on 08/29/17 at 37 weeks; she was discharged to home on PPD#2. Pregnancy complicated by GBS positive  Complains of light bleeding since delivery, states it stopped for about a week and a half and started again on 6/1 and has not stopped. Is intermittent, sometimes heavier than others. Occasional cramping, had an odor directly after delivery but none recently.   Vaginal bleeding or discharge: Yes  Breast or formula feeding: bottle Intercourse: No  Contraception: Micronor PP depression s/s: No  Any bowel or bladder issues: No  Pap smear: no abnormalities (date: 03/2017)  Review of Systems: Positive for n/a.   Her 12 point review of systems is negative or as noted in the History of Present Illness.  Patient Active Problem List   Diagnosis Date Noted  . Normal labor 08/29/2017  . Trichomoniasis 04/07/2017  . Beta thalassemia minor 04/02/2017  . Rh negative, antepartum 03/19/2017  . Anemia, antepartum 03/19/2017  . Advanced maternal age in multigravida, first trimester   . Hereditary disease in family possibly affecting fetus, affecting management of mother, antepartum condition or complication, not applicable or unspecified fetus   . Supervision of high risk pregnancy, antepartum 01/15/2017  . Morning sickness 01/15/2017  . Bipolar I disorder, most recent episode mixed (HCC) 03/05/2014  . Mood disorder (HCC) 03/04/2014  . NSVD (normal spontaneous vaginal delivery) 03/22/2012    Medications Eryka S. Cranfield had no medications administered during this  visit. Current Outpatient Medications  Medication Sig Dispense Refill  . ibuprofen (ADVIL,MOTRIN) 800 MG tablet Take 1 tablet (800 mg total) by mouth every 8 (eight) hours as needed for cramping. 30 tablet 1  . labetalol (NORMODYNE) 200 MG tablet Take 1 tablet (200 mg total) by mouth 2 (two) times daily. 60 tablet 1  . norethindrone (ORTHO MICRONOR) 0.35 MG tablet Take 1 tablet (0.35 mg total) by mouth daily. 1 Package 11   No current facility-administered medications for this visit.     Allergies Hydrocodone; Morphine and related; and Zyprexa [olanzapine]  Physical Exam:  BP 126/78   Pulse 82   Ht 5\' 3"  (1.6 m)   Wt 170 lb 14.4 oz (77.5 kg)   LMP 12/22/2016 Comment: negative preg test tonight  BMI 30.27 kg/m  Body mass index is 30.27 kg/m. General appearance: Well nourished, well developed female in no acute distress.  Cardiovascular: regular rate and rhythm Respiratory:  Clear to auscultation bilateral. Normal respiratory effort Abdomen: positive bowel sounds and no masses, hernias; diffusely non tender to palpation, non distended Breasts: not examined. Neuro/Psych:  Normal mood and affect.  Skin:  Warm and dry.   Pelvic exam: is not limited by body habitus EGBUS: within normal limits Vagina: within normal limits and with Moderate blood in the vault, Cervix:  No lesions   PP Depression Screening:  negative  Assessment: Patient is a 37 y.o. [id number] who is 6 weeks post partum from a SVD. She is doing well except for a prolonged period starting 09/29/17. She is happy with Micronor.  Plan:   1. Vaginal bleeding Likely prolonged period after delivery however  if no improvement 2 weeks, will have patient go for US to rule out retained POC  2. Vaginal discharge cervicoancillary wet prep  3. Status post delivery at term Doing well  RTC prn   K. Therese SarahMeryl Bulson, M.D. Attending Obstetrician & Gynecologist, [facility] for [employer], [facility]

## 2017-10-16 LAB — CERVICOVAGINAL ANCILLARY ONLY
Bacterial vaginitis: POSITIVE — AB
CANDIDA VAGINITIS: NEGATIVE
CHLAMYDIA, DNA PROBE: NEGATIVE
NEISSERIA GONORRHEA: NEGATIVE
TRICH (WINDOWPATH): NEGATIVE

## 2017-10-17 MED ORDER — METRONIDAZOLE 500 MG PO TABS: 500.0000 mg | ORAL_TABLET | Freq: Two times a day (BID) | ORAL | 0 refills | Status: DC

## 2017-10-17 NOTE — Addendum Note (Signed)
Addended by: Leroy LibmanAVIS, KELLY on: 10/17/2017 03:17 PM   Modules accepted: Orders

## 2017-10-22 ENCOUNTER — Inpatient Hospital Stay (HOSPITAL_COMMUNITY)
Admission: AD | Admit: 2017-10-22 | Discharge: 2017-10-22 | Disposition: A | Discharge status: Home / Self Care | Payer: Medicare Other | Source: Ambulatory Visit | Attending: Obstetrics and Gynecology | Admitting: Obstetrics and Gynecology

## 2017-10-22 ENCOUNTER — Inpatient Hospital Stay (HOSPITAL_COMMUNITY): Payer: Medicare Other

## 2017-10-22 ENCOUNTER — Encounter (HOSPITAL_COMMUNITY): Payer: Self-pay | Admitting: *Deleted

## 2017-10-22 ENCOUNTER — Other Ambulatory Visit: Payer: Self-pay

## 2017-10-22 DIAGNOSIS — N939 Abnormal uterine and vaginal bleeding, unspecified: Secondary | ICD-10-CM

## 2017-10-22 DIAGNOSIS — Z87891 Personal history of nicotine dependence: Secondary | ICD-10-CM | POA: Insufficient documentation

## 2017-10-22 DIAGNOSIS — F419 Anxiety disorder, unspecified: Secondary | ICD-10-CM | POA: Insufficient documentation

## 2017-10-22 DIAGNOSIS — F329 Major depressive disorder, single episode, unspecified: Secondary | ICD-10-CM | POA: Diagnosis not present

## 2017-10-22 HISTORY — DX: Headache, unspecified: R51.9

## 2017-10-22 HISTORY — DX: Unspecified infectious disease: B99.9

## 2017-10-22 HISTORY — DX: Headache: R51

## 2017-10-22 HISTORY — DX: Female pelvic inflammatory disease, unspecified: N73.9

## 2017-10-22 HISTORY — DX: Unspecified ovarian cyst, unspecified side: N83.209

## 2017-10-22 LAB — WET PREP, GENITAL
Clue Cells Wet Prep HPF POC: NONE SEEN
SPERM: NONE SEEN
Trich, Wet Prep: NONE SEEN
Yeast Wet Prep HPF POC: NONE SEEN

## 2017-10-22 LAB — URINALYSIS, ROUTINE W REFLEX MICROSCOPIC
BILIRUBIN URINE: NEGATIVE
GLUCOSE, UA: NEGATIVE mg/dL
KETONES UR: NEGATIVE mg/dL
LEUKOCYTES UA: NEGATIVE
NITRITE: NEGATIVE
PH: 5 (ref 5.0–8.0)
Protein, ur: NEGATIVE mg/dL
Specific Gravity, Urine: 1.015 (ref 1.005–1.030)

## 2017-10-22 LAB — POCT PREGNANCY, URINE: Preg Test, Ur: NEGATIVE

## 2017-10-22 MED ORDER — NORGESTIMATE-ETH ESTRADIOL 0.25-35 MG-MCG PO TABS: 1.0000 | ORAL_TABLET | Freq: Every day | ORAL | 11 refills | Status: DC

## 2017-10-22 NOTE — MAU Note (Signed)
Vag delivery 5/1.  Still having some bleeding, small gush and a lot of pressure in the morning, spotting during the day.  Had PP visit last week, treated for bacteria, doesn't seem to be helping.

## 2017-10-22 NOTE — Discharge Instructions (Signed)
Oral Contraception Use Oral contraceptive pills (OCPs) are medicines taken to prevent pregnancy. OCPs work by preventing the ovaries from releasing eggs. The hormones in OCPs also cause the cervical mucus to thicken, preventing the sperm from entering the uterus. The hormones also cause the uterine lining to become thin, not allowing a fertilized egg to attach to the inside of the uterus. OCPs are highly effective when taken exactly as prescribed. However, OCPs do not prevent sexually transmitted diseases (STDs). Safe sex practices, such as using condoms along with an OCP, can help prevent STDs. Before taking OCPs, you may have a physical exam and Pap test. Your health care provider may also order blood tests if necessary. Your health care provider will make sure you are a good candidate for oral contraception. Discuss with your health care provider the possible side effects of the OCP you may be prescribed. When starting an OCP, it can take 2 to 3 months for the body to adjust to the changes in hormone levels in your body. How to take oral contraceptive pills Your health care provider may advise you on how to start taking the first cycle of OCPs. Otherwise, you can:  Start on day 1 of your menstrual period. You will not need any backup contraceptive protection with this start time.  Start on the first Sunday after your menstrual period or the day you get your prescription. In these cases, you will need to use backup contraceptive protection for the first week.  Start the pill at any time of your cycle. If you take the pill within 5 days of the start of your period, you are protected against pregnancy right away. In this case, you will not need a backup form of birth control. If you start at any other time of your menstrual cycle, you will need to use another form of birth control for 7 days. If your OCP is the type called a minipill, it will protect you from pregnancy after taking it for 2 days (48  hours).  After you have started taking OCPs:  If you forget to take 1 pill, take it as soon as you remember. Take the next pill at the regular time.  If you miss 2 or more pills, call your health care provider because different pills have different instructions for missed doses. Use backup birth control until your next menstrual period starts.  If you use a 28-day pack that contains inactive pills and you miss 1 of the last 7 pills (pills with no hormones), it will not matter. Throw away the rest of the non-hormone pills and start a new pill pack.  No matter which day you start the OCP, you will always start a new pack on that same day of the week. Have an extra pack of OCPs and a backup contraceptive method available in case you miss some pills or lose your OCP pack. Follow these instructions at home:  Do not smoke.  Always use a condom to protect against STDs. OCPs do not protect against STDs.  Use a calendar to mark your menstrual period days.  Read the information and directions that came with your OCP. Talk to your health care provider if you have questions. Contact a health care provider if:  You develop nausea and vomiting.  You have abnormal vaginal discharge or bleeding.  You develop a rash.  You miss your menstrual period.  You are losing your hair.  You need treatment for mood swings or depression.  You   get dizzy when taking the OCP.  You develop acne from taking the OCP.  You become pregnant. Get help right away if:  You develop chest pain.  You develop shortness of breath.  You have an uncontrolled or severe headache.  You develop numbness or slurred speech.  You develop visual problems.  You develop pain, redness, and swelling in the legs. This information is not intended to replace advice given to you by your health care provider. Make sure you discuss any questions you have with your health care provider. Document Released: 04/06/2011 Document  Revised: 09/23/2015 Document Reviewed: 10/06/2012 Elsevier Interactive Patient Education  2017 Elsevier Inc.  

## 2017-10-22 NOTE — MAU Provider Note (Signed)
History     CSN: [id number]  Arrival date and time: 10/22/17 2130   First Provider Initiated Contact with Patient 10/22/17 1012      Chief Complaint  Patient presents with  . Vaginal Bleeding  . Pelvic Pain   HPI Angela Andrade is a 37 y.o. [phone] nonpregnant postpartum patient who presents to MAU with chief complaint of suprapubic pressure in the setting of vaginal bleeding. She is s/p vaginal delivery over intact perineum 08/29/17. Postpartum course was uncomplicated and she was sent home with rx for Micronor.  Denies painful urination, discomfort during intercourse, constipation, abdominal pain, headache, lightheadedness, fever, falls, or recent illness.    Suprapubic pressure New problem. Reports she feels pressure in her low abdomen as soon as she wakes up each morning. States she is taking Motrin "way too much". Voiding does not provide relief of pressure but "Motrin does help".  Vaginal Bleeding Reports that vaginal bleeding lightened significantly around 09/29/17 "but not sure it ever really stopped".  Reports most recent sexual intercourse was one week ago, did not affect level of bleeding. States she has been wearing panty liners not menstrual pads most days and saturates several pantiliners throughout the day.  Issue discussed with Dr. Earlene Plater at her postpartum appointment 10/17/17. Patient diagnosed with Bacterial Vaginosis and given rx for Flagyl. Patient states she completed medication and has not seen improvement in her symptoms  OB History    Gravida  4   Para  4   Term  4   Preterm      AB      Living  4     SAB      TAB      Ectopic      Multiple  0   Live Births  4           Past Medical History:  Diagnosis Date  . Anemia   . Anxiety   . Arthritis   . Blood transfusion without reported diagnosis   . Depression    doing ok  . Headache   . Infection    UTI  . Ovarian cyst   . PID (pelvic inflammatory disease)     Past Surgical  History:  Procedure Laterality Date  . brain injury    . coma    . HIP FRACTURE SURGERY    . PELVIC FRACTURE SURGERY    . spleen repair      Family History  Problem Relation Age of Onset  . Diabetes Maternal Uncle   . Hypertension Maternal Uncle   . Cancer Mother        breast  . Hypertension Mother   . Diabetes Maternal Aunt   . Hypertension Maternal Aunt   . Cancer Maternal Grandmother   . Diabetes Maternal Grandmother   . Hypertension Maternal Grandmother     Social History   Tobacco Use  . Smoking status: Former Smoker    Packs/day: 0.25    Types: Cigarettes    Last attempt to quit: 01/29/2017    Years since quitting: 0.7  . Smokeless tobacco: Never Used  Substance Use Topics  . Alcohol use: No  . Drug use: No    Allergies:  Allergies  Allergen Reactions  . Hydrocodone Itching    Ok to take with benadryl  . Morphine And Related Itching    Ok to take with benadryl  . Zyprexa [Olanzapine] Other (See Comments)    hallucinations    Medications Prior to Admission  Medication Sig Dispense Refill Last Dose  . ibuprofen (ADVIL,MOTRIN) 800 MG tablet Take 1 tablet (800 mg total) by mouth every 8 (eight) hours as needed for cramping. 30 tablet 1 10/21/2017 at Unknown time  . labetalol (NORMODYNE) 200 MG tablet Take 1 tablet (200 mg total) by mouth 2 (two) times daily. 60 tablet 1 Past Month at Unknown time  . metroNIDAZOLE (FLAGYL) 500 MG tablet Take 1 tablet (500 mg total) by mouth 2 (two) times daily. 14 tablet 0 10/21/2017 at Unknown time  . norethindrone (ORTHO MICRONOR) 0.35 MG tablet Take 1 tablet (0.35 mg total) by mouth daily. 1 Package 11 10/21/2017 at Unknown time    Review of Systems  Respiratory: Negative for shortness of breath.   Gastrointestinal: Positive for abdominal pain. Negative for constipation, diarrhea, nausea, rectal pain and vomiting.       "Pressure near my bladder every morning when I wake up"  Endocrine: Negative for polydipsia, polyphagia  and polyuria.  Genitourinary: Positive for vaginal bleeding. Negative for difficulty urinating, dyspareunia, dysuria, vaginal discharge and vaginal pain.  Neurological: Negative for light-headedness and headaches.   Physical Exam   Blood pressure 128/88, pulse 75, temperature 98.5 F (36.9 C), temperature source Oral, resp. rate 16, weight 173 lb 4 oz (78.6 kg), SpO2 100 %, not currently breastfeeding.  Physical Exam  Nursing note and vitals reviewed. Constitutional: She is oriented to person, place, and time. She appears well-developed and well-nourished.  Cardiovascular: Normal rate, regular rhythm, normal heart sounds and intact distal pulses.  Respiratory: Effort normal and breath sounds normal. No respiratory distress.  GI: Soft. Bowel sounds are normal. She exhibits no distension and no mass. There is tenderness. There is no rebound and no guarding.  Mild suprapubic tenderness  Neurological: She is alert and oriented to person, place, and time. She has normal reflexes.  Skin: Skin is warm and dry.  Psychiatric: She has a normal mood and affect. Her behavior is normal. Judgment and thought content normal.   Patient declines internal exam and speculum exam. Swabs collected via blind sample. Dark red sersanguinous discharge visible on swabs and labia upon collection.  MAU Course  Procedures  MDM No retained products of conception Hemodynamically stable Unremarkable physical exam Unable to ensure consistent Micronor use Desires switch to other contraception  Orders Placed This Encounter  Procedures  . Wet prep, genital    Standing Status:   Standing    Number of Occurrences:   1  . US PELVIC COMPLETE WITH TRANSVAGINAL    SVD 05/01 continues to have frank red vaginal bleeding    Standing Status:   Standing    Number of Occurrences:   1    Order Specific Question:   Symptom/Reason for Exam    Answer:   Postpartum bleeding [[id number]]  . Urinalysis, Routine w reflex microscopic     Standing Status:   Standing    Number of Occurrences:   1  . Pregnancy, urine POC    Standing Status:   Standing    Number of Occurrences:   1  . Discharge patient    Order Specific Question:   Discharge disposition    Answer:   01-Home or Self Care [1]    Order Specific Question:   Discharge patient date    Answer:   10/22/2017   US Pelvic Complete With Transvaginal  Result Date: 10/22/2017 CLINICAL DATA:  Status post spontaneous vaginal delivery on May 1st, with continued frank red vaginal bleeding EXAM: TRANSABDOMINAL  AND TRANSVAGINAL ULTRASOUND OF PELVIS TECHNIQUE: Both transabdominal and transvaginal ultrasound examinations of the pelvis were performed. Transabdominal technique was performed for global imaging of the pelvis including uterus, ovaries, adnexal regions, and pelvic cul-de-sac. It was necessary to proceed with endovaginal exam following the transabdominal exam to visualize the endometrium. COMPARISON:  None FINDINGS: Uterus Measurements: 9.3 x 5.0 x 5.5 cm. No fibroids or other mass visualized. Endometrium Thickness: 6 mm.  No focal abnormality visualized. Right ovary Measurements: 3.9 x 2.1 x 1.5 cm. Normal appearance/no adnexal mass. Left ovary Measurements: 2.4 x 1.5 x 1.5 cm. Normal appearance/no adnexal mass. Other findings No abnormal free fluid. IMPRESSION: Negative pelvic ultrasound. Endometrial complex measures 6 mm, within normal limits. No findings suspicious for retained products of conception. Electronically Signed   By: Charline BillsSriyesh  Krishnan M.D.   On: 10/22/2017 11:20    Assessment and Plan  --37 y.o. G4P4004 at 7 weeks 5 days postpartum after NSVD over intact perineum and uncomplicated PP course --Normal prolonged bleeding after delivery --Spotting/bleeding associated with inconsistent Micronor administration --Switch to COC Sprintec  --Reviewed ACHES protocol --May follow up up in clinic if dissatisfied with COC --Discharge home in stable condition  Calvert CantorSamantha  C Weinhold, CNM 10/22/2017, 11:51 AM

## 2017-10-23 LAB — GC/CHLAMYDIA PROBE AMP (~~LOC~~) NOT AT ARMC
Chlamydia: NEGATIVE
Neisseria Gonorrhea: NEGATIVE

## 2017-11-28 ENCOUNTER — Telehealth: Payer: Self-pay | Admitting: *Deleted

## 2017-11-28 NOTE — Telephone Encounter (Signed)
Received fax for renewal of labetolol. Patient now delivered, on discharge instructions not ordered to take labetlolol. HTN not listed on problem list or in chart. Declined refill.

## 2018-01-21 IMAGING — US US OB COMP LESS 14 WK
1 series · 15 of 28 positions shown · non-contrast
Comparison: None.

CLINICAL DATA: Nausea.  Pregnancy of unknown anatomic location.

EXAM:
OBSTETRIC <14 WK US AND TRANSVAGINAL OB US
TECHNIQUE: Both transabdominal and transvaginal ultrasound examinations were
performed for complete evaluation of the gestation as well as the
maternal uterus, adnexal regions, and pelvic cul-de-sac.
Transvaginal technique was performed to assess early pregnancy.

[Series 1: us ob comp less 14 wk · 15 of 49 slices shown]
[im 1/49]
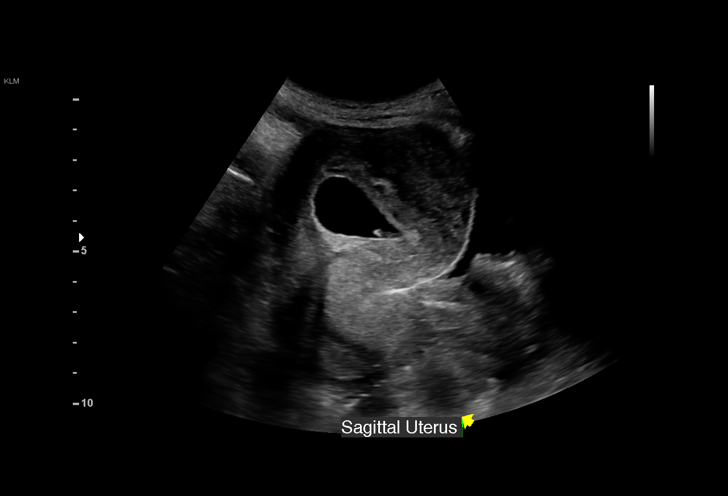
[im 4/49]
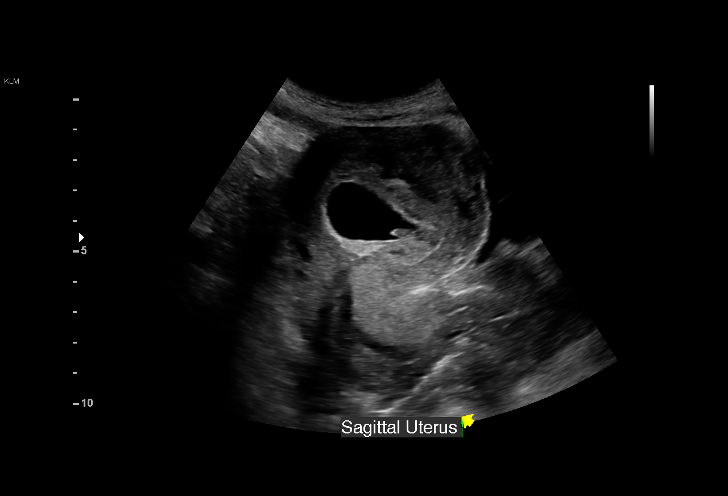
[im 8/49]
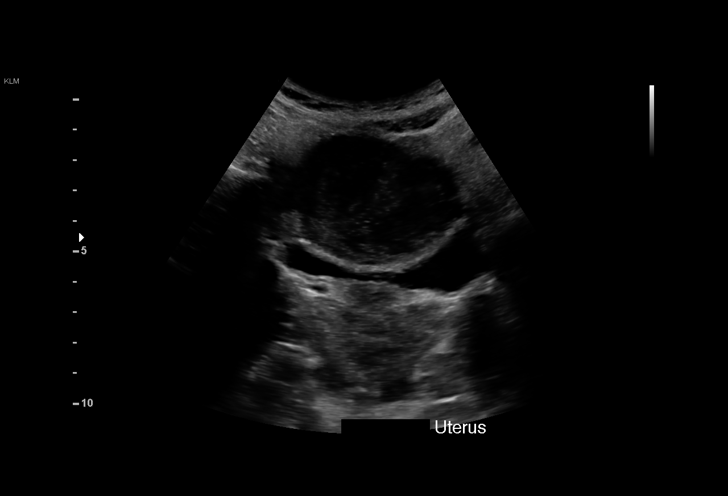
[im 11/49]
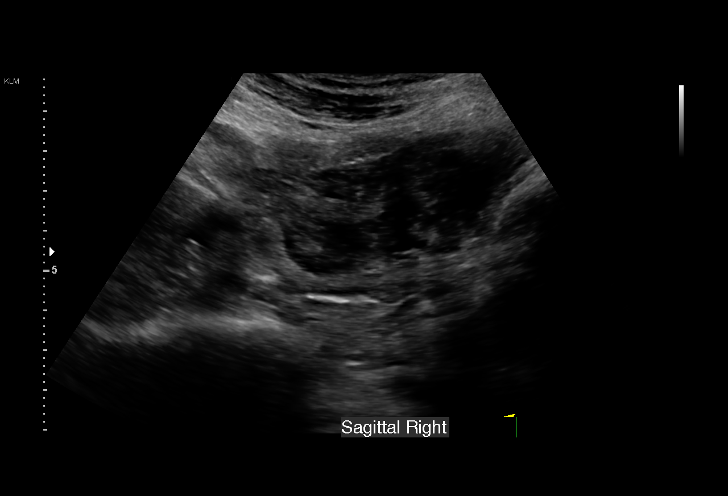
[im 15/49]
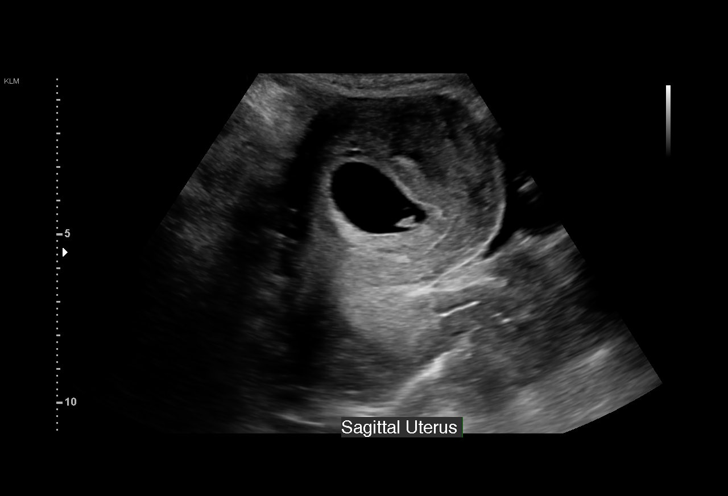
[im 18/49]
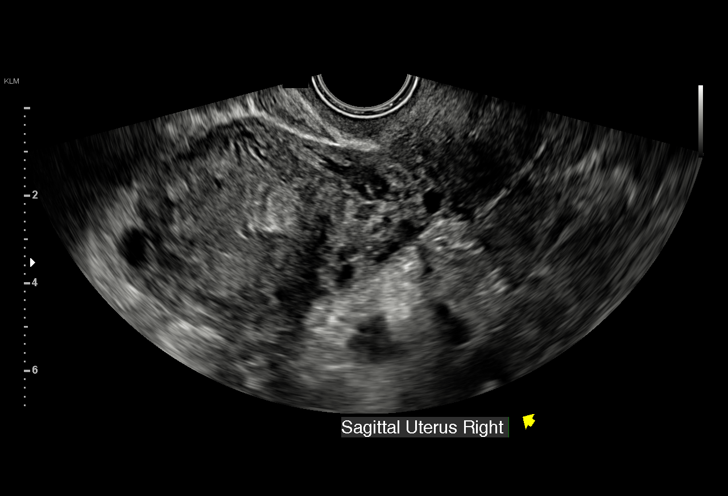
[im 22/49]
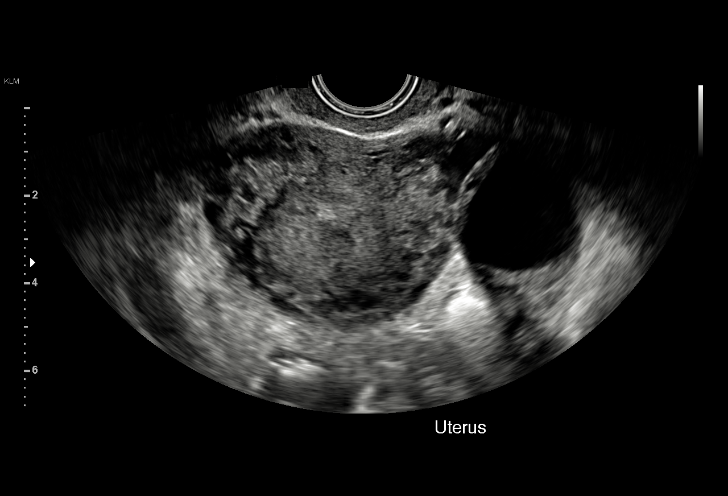
[im 25/49]
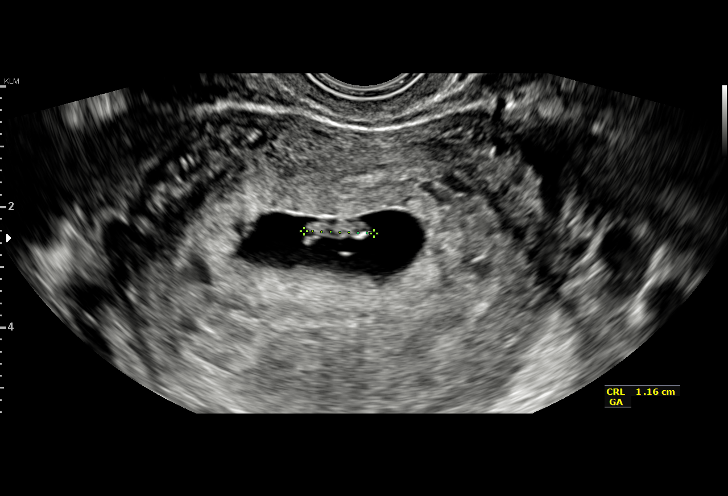
[im 27/49]
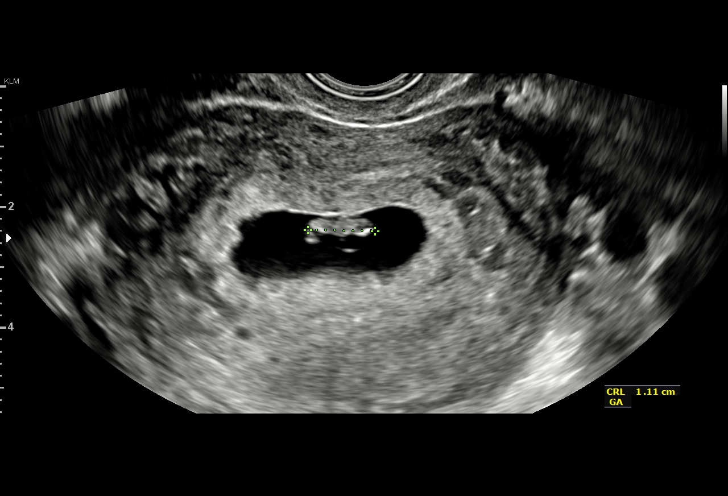
[im 31/49]
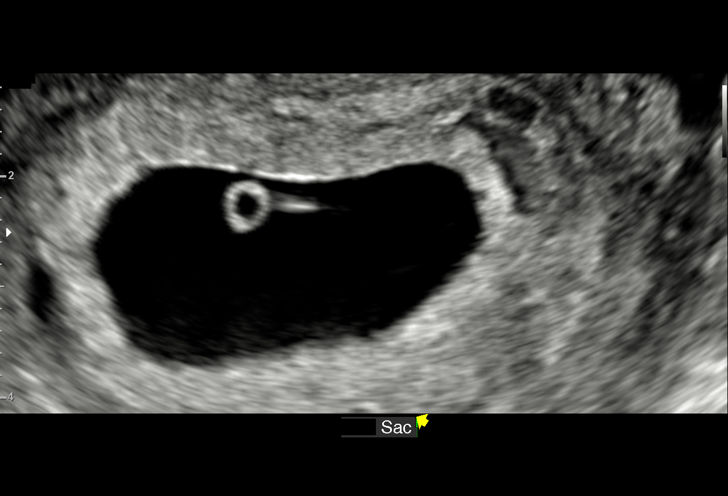
[im 34/49]
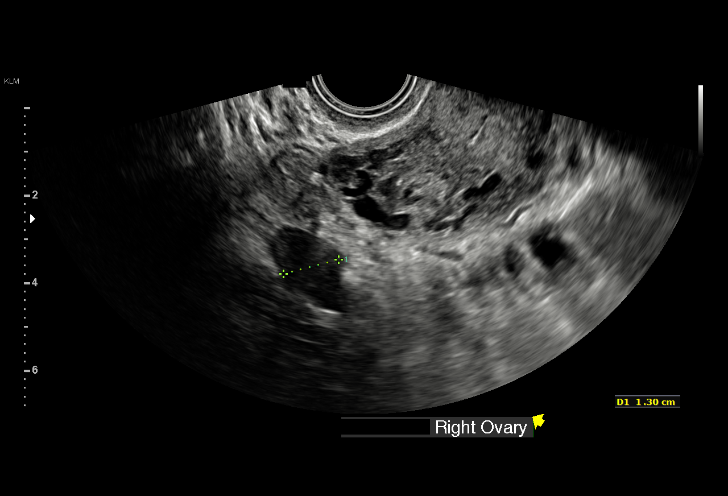
[im 38/49]
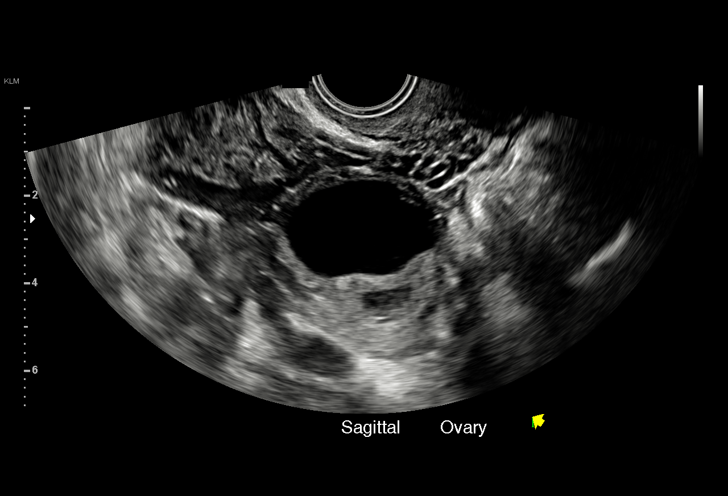
[im 41/49]
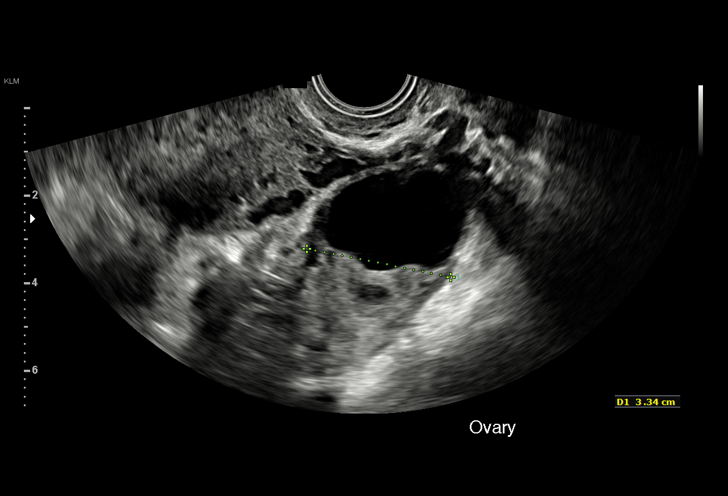
[im 45/49]
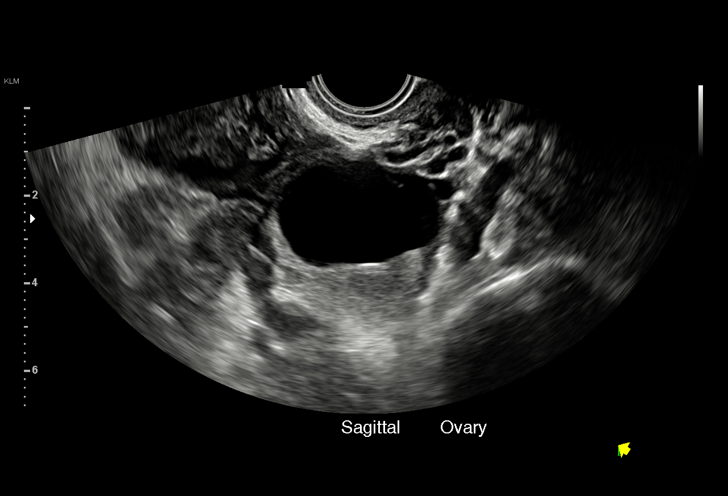
[im 49/49]
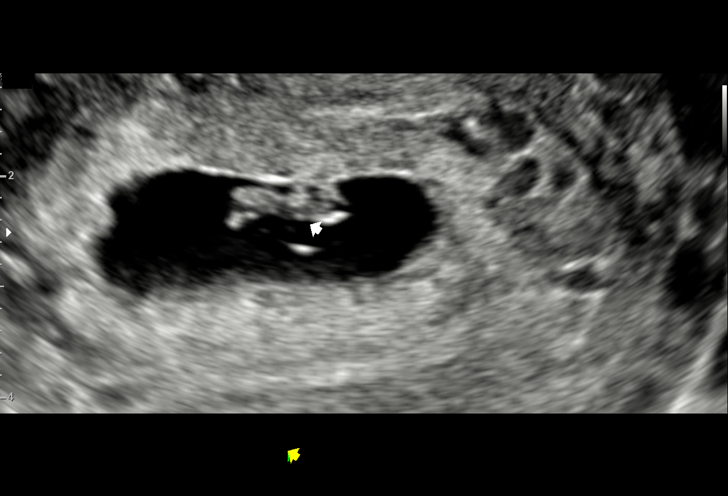

[15 of 28 positions shown; findings below may reference images not displayed]

FINDINGS: Intrauterine gestational sac: Single

Yolk sac:  Visualized.

Embryo:  Visualized.

Cardiac Activity: Visualized.

Heart Rate: 135  bpm

CRL:  11  mm   7 w   1  d                  US EDC: 09/18/2017

Subchorionic hemorrhage:  None visualized.

Maternal uterus/adnexae: 3.8 cm simple left ovarian cyst with benign
features. Normal appearance of right ovary. No adnexal mass or
abnormal free fluid identified.
IMPRESSION: Single living IUP measuring 7 weeks 1 day, with US EDC of
09/18/2017.

3.8 cm benign-appearing left ovarian cyst.

## 2018-01-24 ENCOUNTER — Encounter: Payer: Self-pay | Admitting: *Deleted

## 2018-05-09 ENCOUNTER — Telehealth: Payer: Self-pay | Admitting: General Practice

## 2018-05-09 NOTE — Telephone Encounter (Signed)
Patient called and left message on nurse voicemail line stating she wants to talk to someone about getting her birth control changed. Patient states she has gained so much weight on this birth control & now weighs 190 lbs. Called patient and discussed different birth controls with her. Discussed the best way to decrease her chance of birth control causing weight gain is to look at the more long term options that have less hormones like the IUD or the Nexplanon. Patient verbalized understanding & states she just isn't sure she wants to do one of those right now. Patient states she will try to work on weight loss at home and get back in touch with [place] for an appt if she needs one. Patient had no other questions.

## 2018-05-19 IMAGING — US US MFM OB FOLLOW-UP
1 series · 14 of 28 positions shown · non-contrast
Comparison: none

[Series 1: us mfm ob follow-up · 54 acquisitions, 14 frames shown]
[im 2/54]
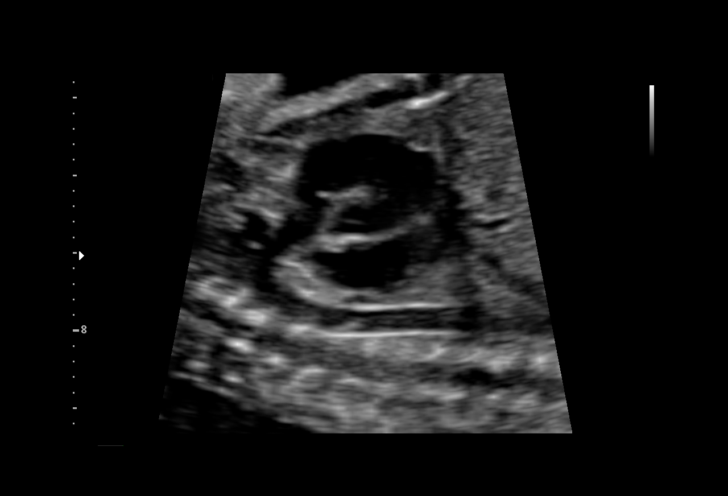
[im 6/54]
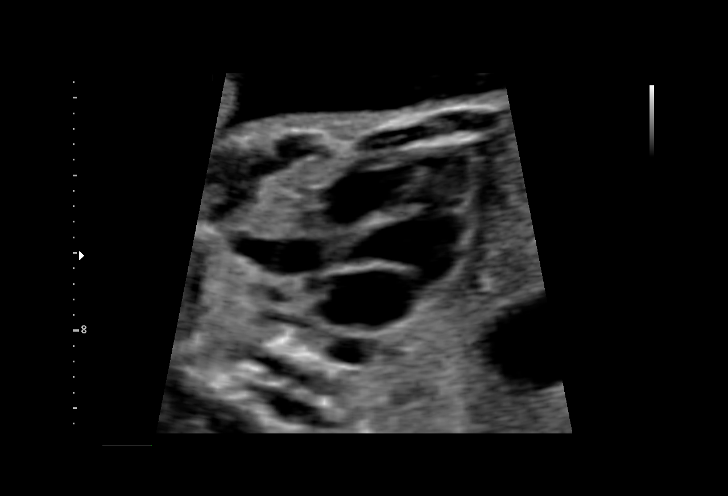
[im 10/54]
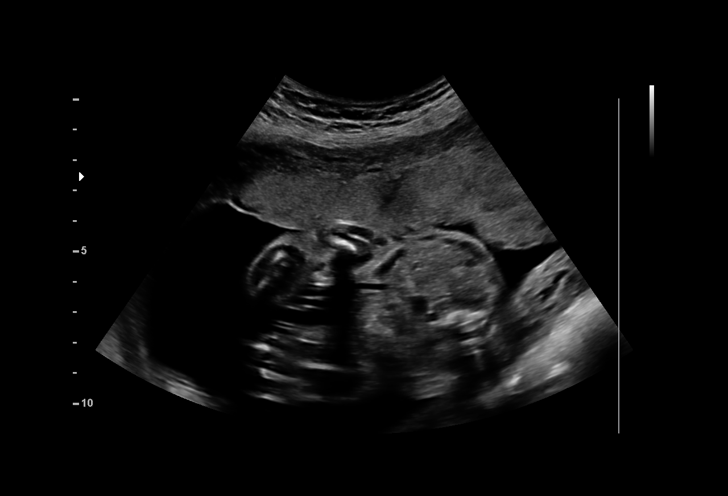
[im 14/54]
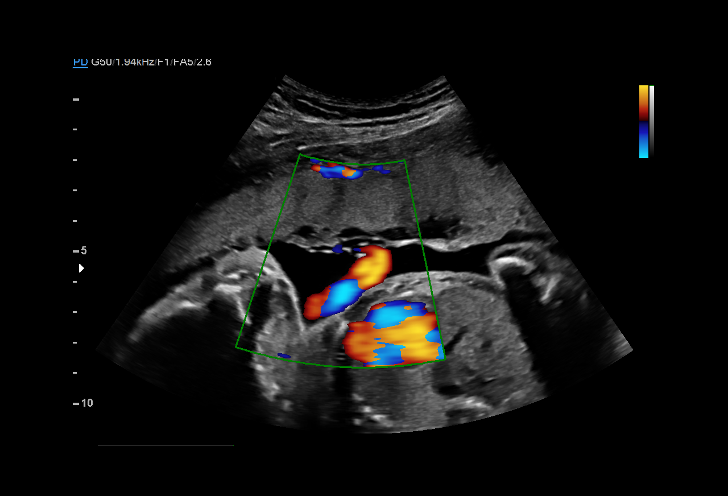
[im 18/54]
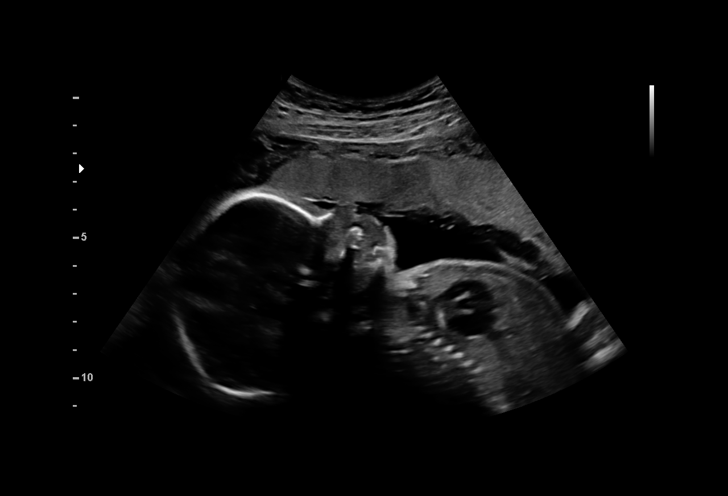
[im 22/54]
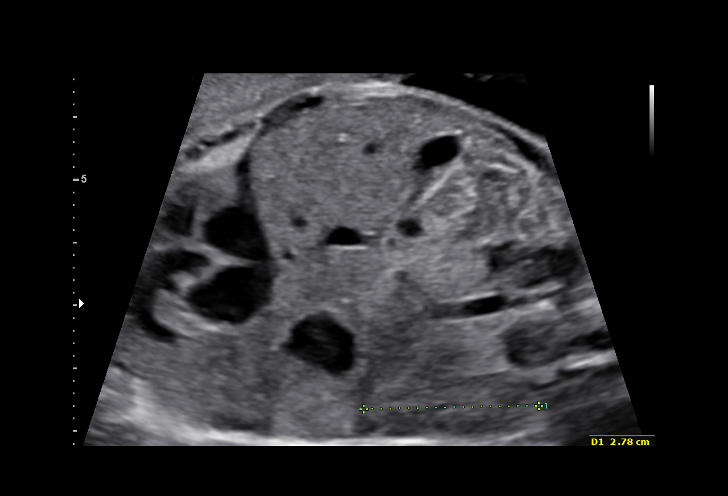
[im 26/54]
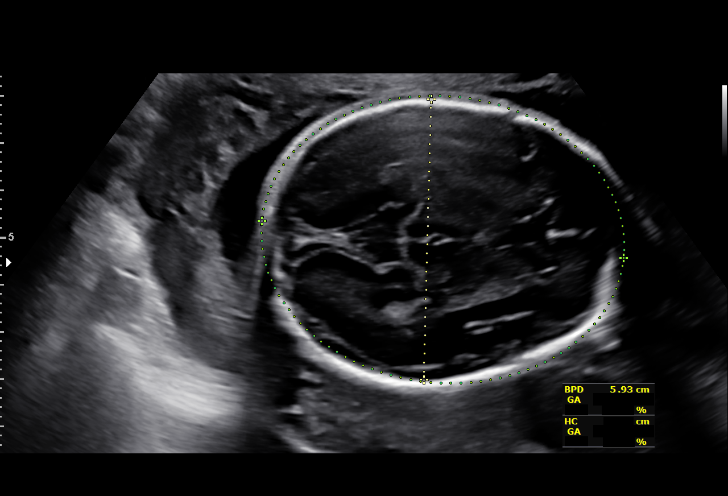
[im 30/54]
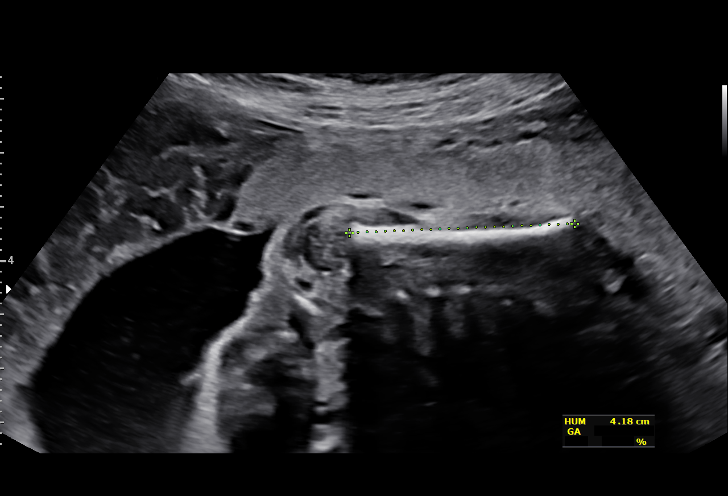
[im 34/54]
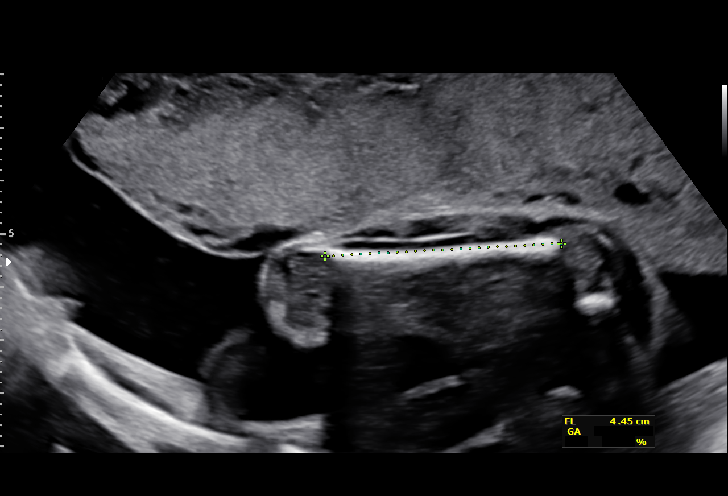
[im 38/54]
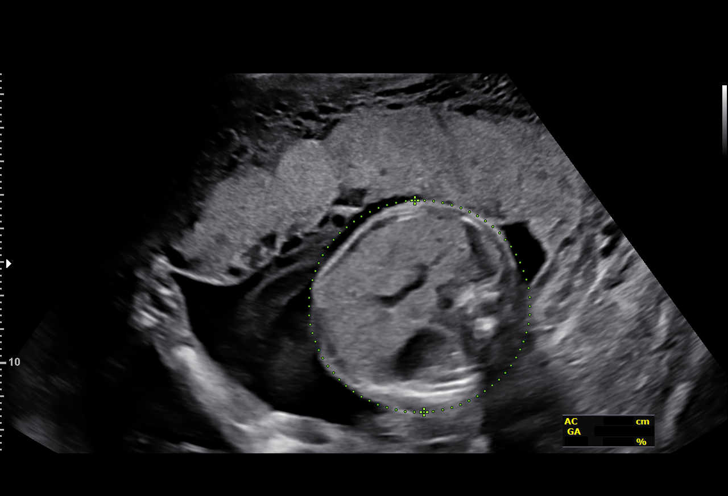
[im 42/54]
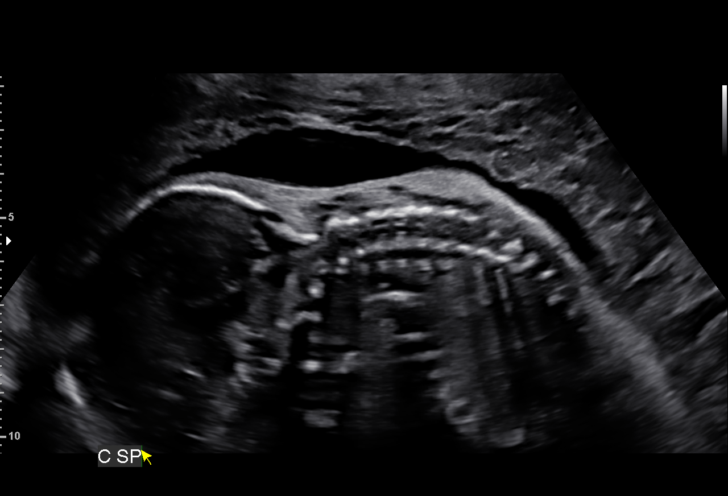
[im 46/54]
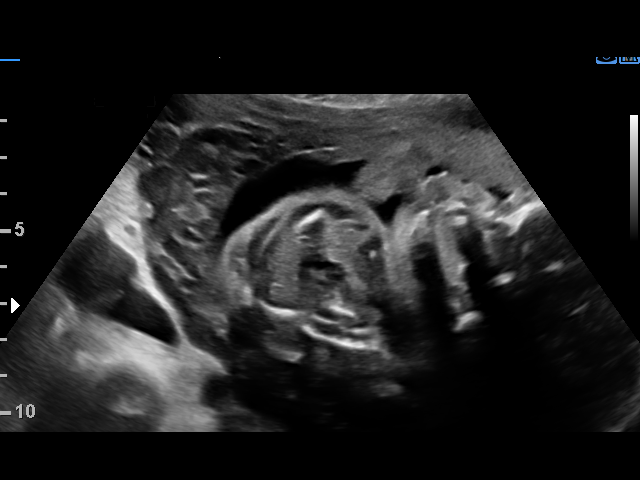
[im 50/54]
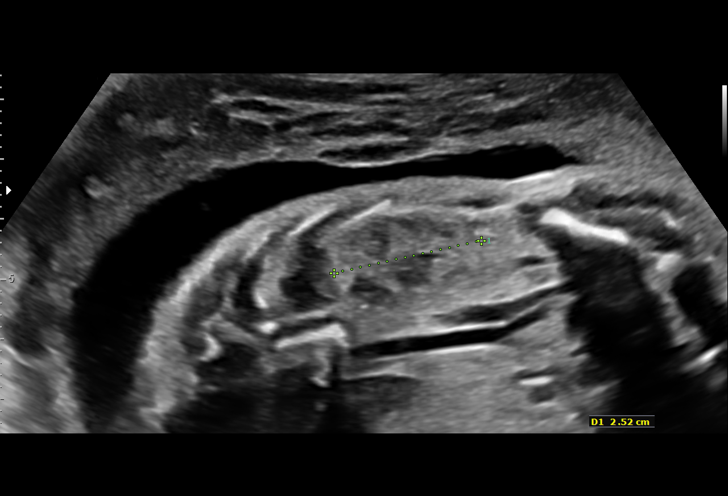
[im 54/54]
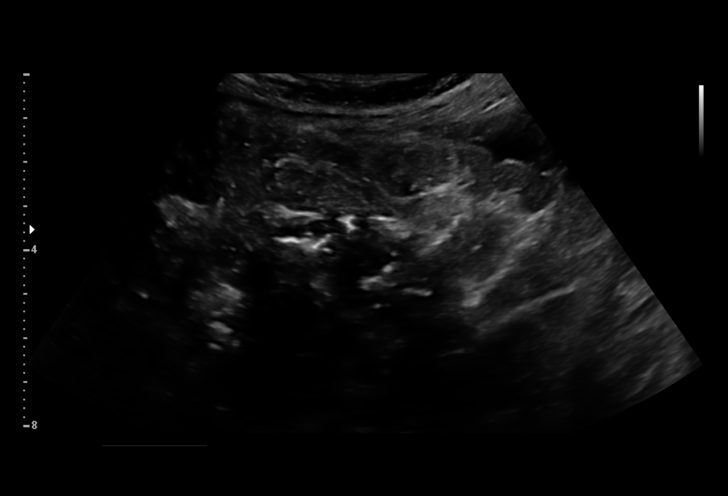

[14 of 28 positions shown; findings below may reference images not displayed]

OB/Gyn Clinic

1  OUASSILA SERDOUK          [id number]      [id number]     [id number]
Indications

24 weeks gestation of pregnancy
Advanced maternal age multigravida (36),
second trimester (low risk NIPS)
Encounter for other antenatal screening
follow-up
OB History

Blood Type:            Height:  5'3"   Weight (lb):  132       BMI:
Gravidity:    4         Term:   3        Prem:   0        SAB:   0
TOP:          0       Ectopic:  0        Living: 3
Fetal Evaluation

Num Of Fetuses:     1
Cardiac Activity:   Observed
Presentation:       Transverse, head to maternal right
Placenta:           Anterior, above cervical os
P. Cord Insertion:  Visualized

Amniotic Fluid
AFI FV:      Subjectively within normal limits

Largest Pocket(cm)
7.03
Biometry
BPD:      59.5  mm     G. Age:  24w 2d         54  %    CI:        75.96   %    70 - 86
FL/HC:      20.4   %    18.7 -
HC:      216.4  mm     G. Age:  23w 4d         22  %    HC/AC:      1.07        1.05 -
AC:       203   mm     G. Age:  24w 6d         70  %    FL/BPD:     74.3   %    71 - 87
FL:       44.2  mm     G. Age:  24w 4d         55  %    FL/AC:      21.8   %    20 - 24
HUM:      42.1  mm     G. Age:  25w 2d         74  %
CER:      27.9  mm     G. Age:  25w 0d         71  %

Est. FW:     717  gm      1 lb 9 oz     64  %
Gestational Age

U/S Today:     24w 2d                                        EDD:   09/16/17
Best:          24w 0d     Det. By:  Early Ultrasound         EDD:   09/18/17
(01/31/17)
Anatomy

Cranium:               Appears normal         Aortic Arch:            Appears normal
Cavum:                 Appears normal         Ductal Arch:            Previously seen
Ventricles:            Appears normal         Diaphragm:              Previously seen
Choroid Plexus:        Previously seen        Stomach:                Appears normal, left
sided
Cerebellum:            Appears normal         Abdomen:                Appears normal
Posterior Fossa:       Appears normal         Abdominal Wall:         Previously seen
Nuchal Fold:           Previously seen        Cord Vessels:           Appears normal (3
vessel cord)
Face:                  Orbits and profile     Kidneys:                Appear normal
previously seen
Lips:                  Previously seen        Bladder:                Appears normal
Thoracic:              Appears normal         Spine:                  Limited views
appear normal
Heart:                 Previously seen        Upper Extremities:      Appears normal
RVOT:                  Appears normal         Lower Extremities:      Appears normal
LVOT:                  Appears normal

Other:  Fetus appears to be a male. Heels and 5th digit prev.  visualized.
Nasal bone prev,  visualized.
Cervix Uterus Adnexa

Cervix
Length:            4.2  cm.
Normal appearance by transabdominal scan.

Uterus
No abnormality visualized.

Left Ovary
Not visualized.

Right Ovary
Size(cm)       2.6  x   1.3    x  1.2       Vol(ml):
Within normal limits.

Cul De Sac:   No free fluid seen.

Adnexa:       No abnormality visualized.
Impression

Singleton intrauterine pregnancy at 24+0 weeks with AMA
here for completion of anatomic survey
Interval review of the anatomy shows no sonographic
markers for aneuploidy or structural anomalies
All relevant fetal anatomy has been visualized
Amniotic fluid volume is normal
Estimated fetal weight shows growth in the 64th percentile
Recommendations

Follow-up ultrasounds as clinically indicated.

## 2018-06-21 ENCOUNTER — Other Ambulatory Visit: Payer: Self-pay

## 2018-09-02 ENCOUNTER — Other Ambulatory Visit: Payer: Self-pay | Admitting: *Deleted

## 2018-09-02 ENCOUNTER — Encounter: Payer: Self-pay | Admitting: *Deleted

## 2018-09-02 MED ORDER — NORGESTIMATE-ETH ESTRADIOL 0.25-35 MG-MCG PO TABS: 1.0000 | ORAL_TABLET | Freq: Every day | ORAL | 2 refills | Status: DC

## 2018-09-02 NOTE — Progress Notes (Signed)
Rx refill request received from [facility] for Sprintec. Pt last seen in office on 10/15/17 for post partum visit. Refill for 3 month supply e-prescribed per standing order. MyChart message sent to pt to inform of limited refill and that she needs annual exam.

## 2018-10-12 IMAGING — US US PELVIS COMPLETE TRANSABD/TRANSVAG
1 series · 15 of 25 positions shown · non-contrast
Comparison: None

CLINICAL DATA: Status post spontaneous vaginal delivery on [DATE]st,
with continued frank red vaginal bleeding



[Series 1: us pelvis complete transabd/transvag · 15 of 31 slices shown]
[im 1/31]
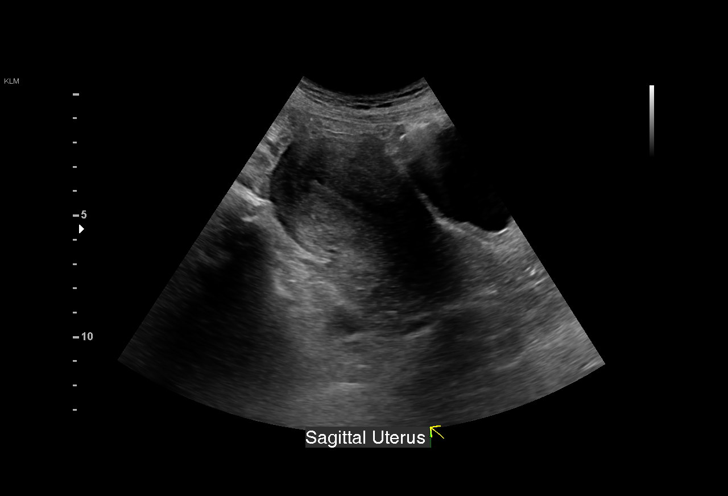
[im 3/31]
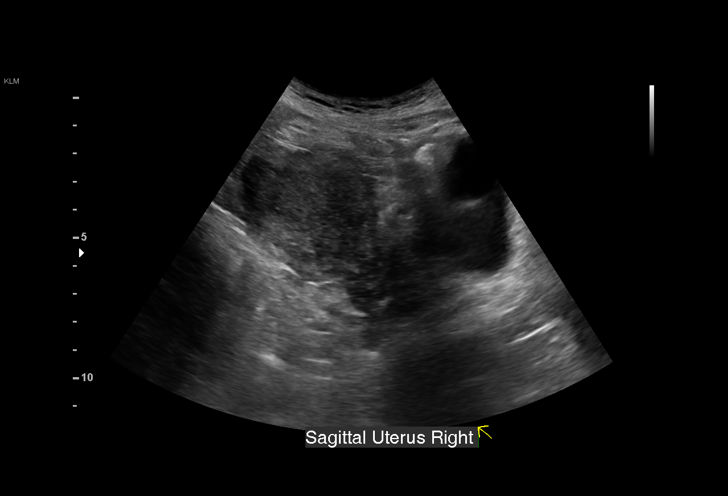
[im 6/31]
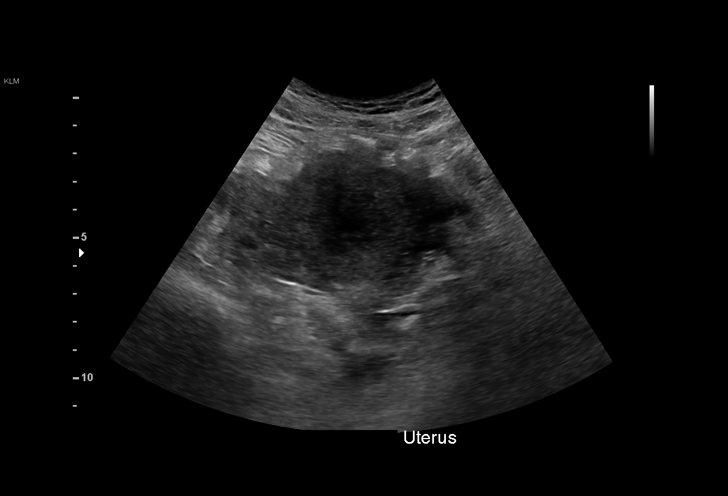
[im 7/31]
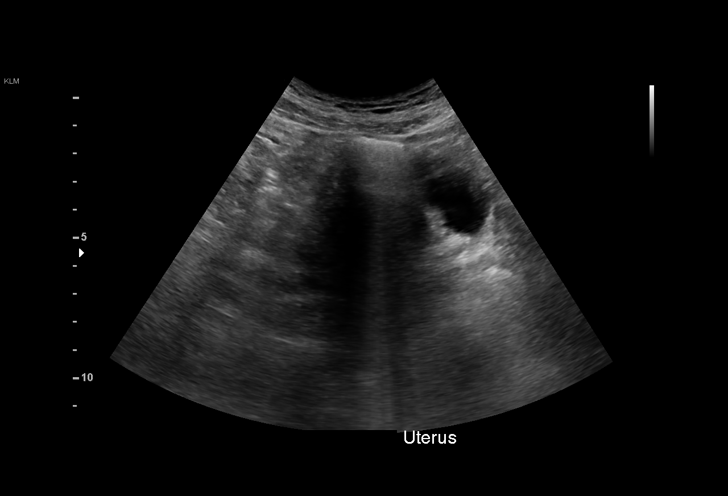
[im 9/31]
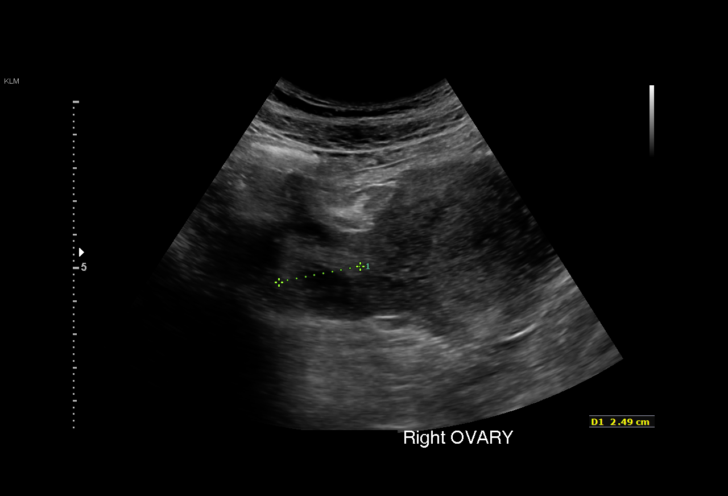
[im 12/31]
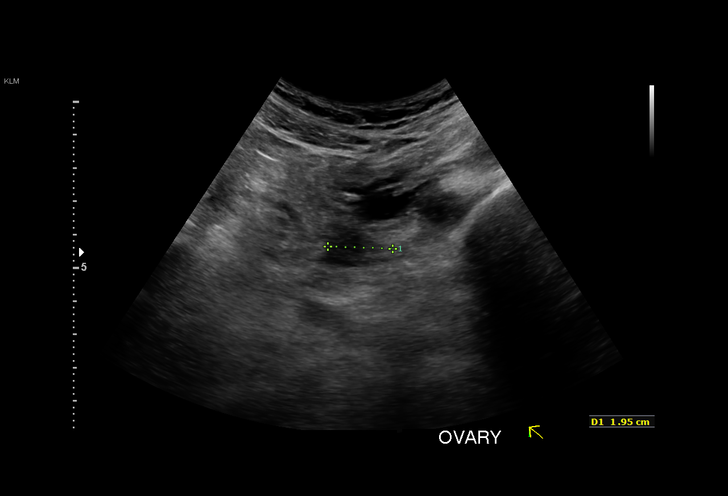
[im 13/31]
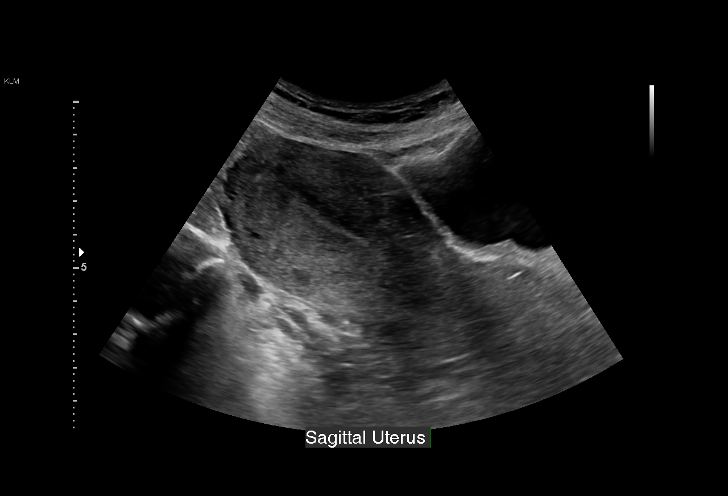
[im 16/31]
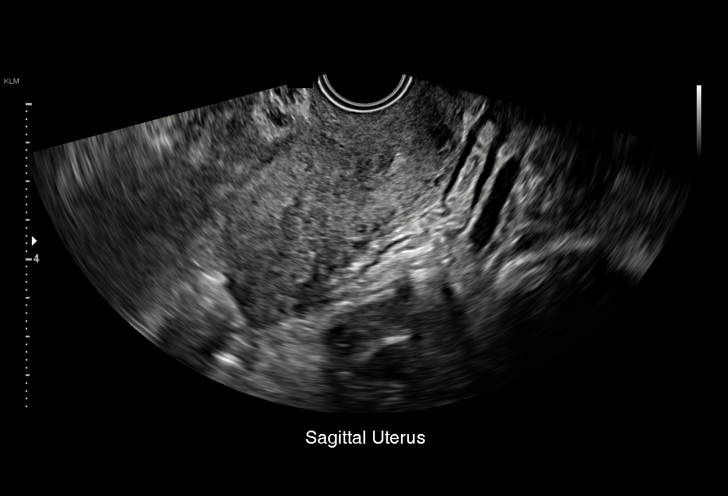
[im 18/31]
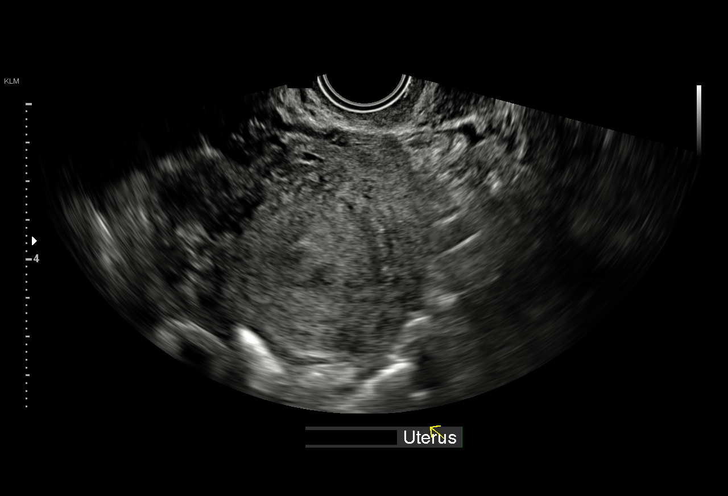
[im 19/31]
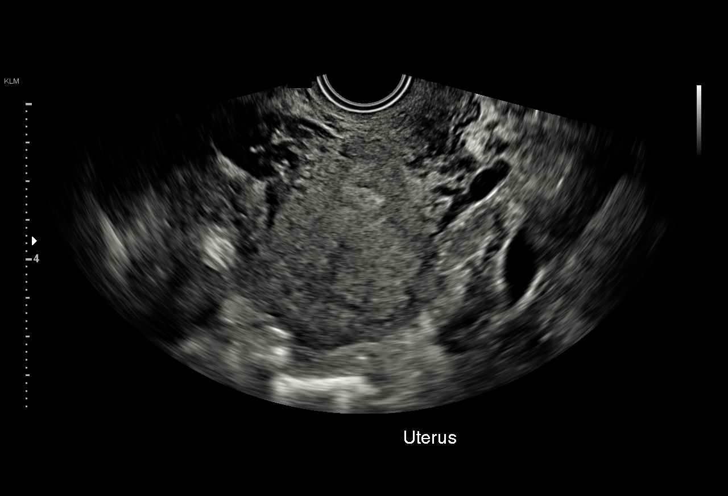
[im 22/31]
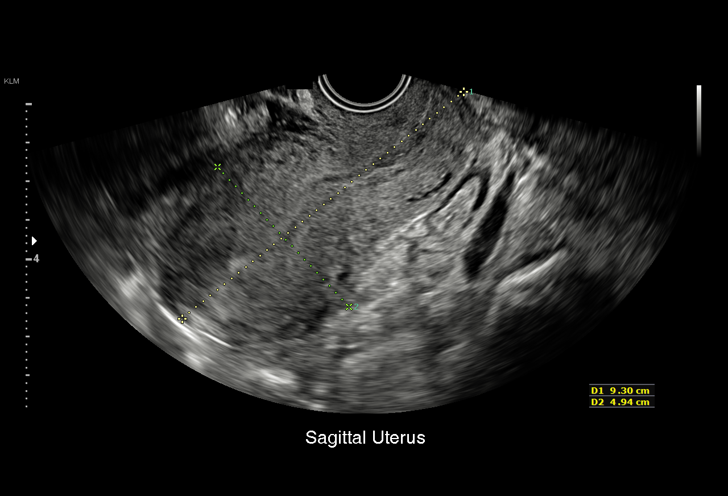
[im 24/31]
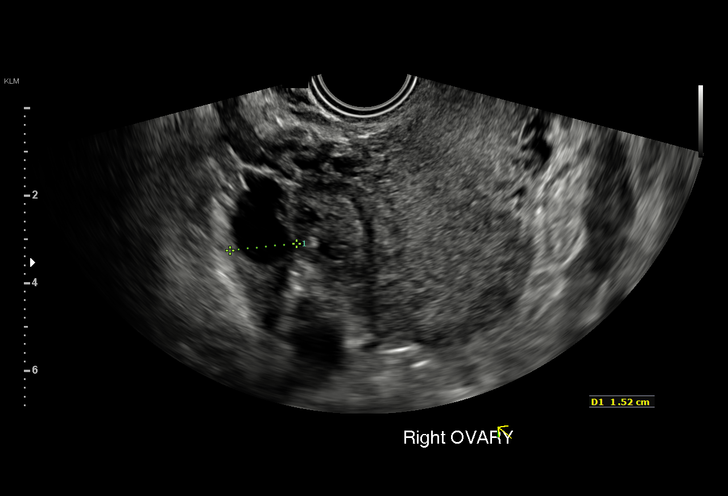
[im 26/31]
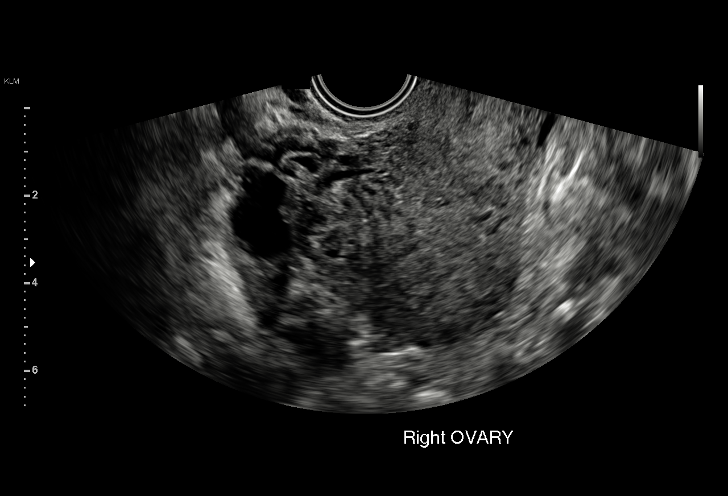
[im 28/31]
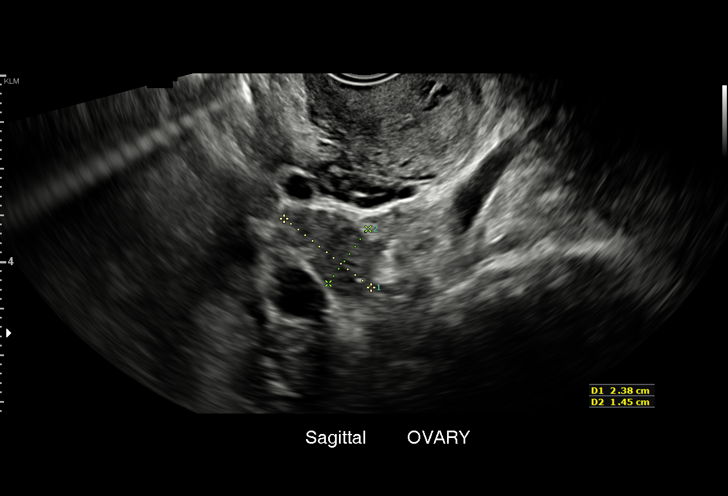
[im 31/31]
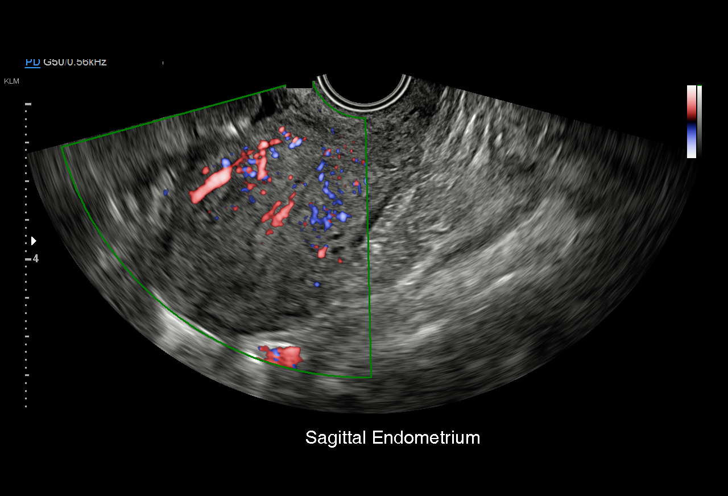

[15 of 25 positions shown; findings below may reference images not displayed]

FINDINGS: Uterus

Measurements: 9.3 x 5.0 x 5.5 cm. No fibroids or other mass
visualized.

Endometrium

Thickness: 6 mm.  No focal abnormality visualized.

Right ovary

Measurements: 3.9 x 2.1 x 1.5 cm. Normal appearance/no adnexal mass.

Left ovary

Measurements: 2.4 x 1.5 x 1.5 cm. Normal appearance/no adnexal mass.

Other findings

No abnormal free fluid.
IMPRESSION: Negative pelvic ultrasound.

Endometrial complex measures 6 mm, within normal limits. No findings
suspicious for retained products of conception.

## 2018-11-06 ENCOUNTER — Other Ambulatory Visit: Payer: Self-pay

## 2018-11-06 ENCOUNTER — Ambulatory Visit (INDEPENDENT_AMBULATORY_CARE_PROVIDER_SITE_OTHER): Payer: Medicare Other | Admitting: Neurology

## 2018-11-06 ENCOUNTER — Encounter: Payer: Self-pay | Admitting: Neurology

## 2018-11-06 VITALS — BP 126/93 | HR 73 | Temp 97.8°F | Ht 64.0 in | Wt 184.0 lb

## 2018-11-06 DIAGNOSIS — R413 Other amnesia: Secondary | ICD-10-CM

## 2018-11-06 DIAGNOSIS — S069X9A Unspecified intracranial injury with loss of consciousness of unspecified duration, initial encounter: Secondary | ICD-10-CM | POA: Diagnosis not present

## 2018-11-06 DIAGNOSIS — S069XAA Unspecified intracranial injury with loss of consciousness status unknown, initial encounter: Secondary | ICD-10-CM | POA: Insufficient documentation

## 2018-11-06 DIAGNOSIS — R410 Disorientation, unspecified: Secondary | ICD-10-CM | POA: Diagnosis not present

## 2018-11-06 DIAGNOSIS — A64 Unspecified sexually transmitted disease: Secondary | ICD-10-CM

## 2018-11-06 DIAGNOSIS — Z114 Encounter for screening for human immunodeficiency virus [HIV]: Secondary | ICD-10-CM

## 2018-11-06 NOTE — Patient Instructions (Signed)
MRI of the brain Labs today  Memory Compensation Strategies  1. Use "WARM" strategy.  W= write it down  A= associate it  R= repeat it  M= make a mental note  2.   You can keep a Glass blower/designerMemory Notebook.  Use a 3-ring notebook with sections for the following: calendar, important names and phone numbers,  medications, doctors' names/phone numbers, lists/reminders, and a section to journal what you did  each day.   3.    Use a calendar to write appointments down.  4.    Write yourself a schedule for the day.  This can be placed on the calendar or in a separate section of the Memory Notebook.  Keeping a  regular schedule can help memory.  5.    Use medication organizer with sections for each day or morning/evening pills.  You may need help loading it  6.    Keep a basket, or pegboard by the door.  Place items that you need to take out with you in the basket or on the pegboard.  You may also want to  include a message board for reminders.  7.    Use sticky notes.  Place sticky notes with reminders in a place where the task is performed.  For example: " turn off the  stove" placed by the stove, "lock the door" placed on the door at eye level, " take your medications" on  the bathroom mirror or by the place where you normally take your medications.  8.    Use alarms/timers.  Use while cooking to remind yourself to check on food or as a reminder to take your medicine, or as a  reminder to make a call, or as a reminder to perform another task, etc.  Major Depressive Disorder, Adult Major depressive disorder (MDD) is a mental health condition. MDD often makes you feel sad, hopeless, or helpless. MDD can also cause symptoms in your body. MDD can affect your:  Work.  School.  Relationships.  Other normal activities. MDD can range from mild to very bad. It may occur once (single episode MDD). It can also occur many times (recurrent MDD). The main symptoms of MDD often include:  Feeling sad,  depressed, or irritable most of the time.  Loss of interest. MDD symptoms also include:  Sleeping too much or too little.  Eating too much or too little.  A change in your weight.  Feeling tired (fatigue) or having low energy.  Feeling worthless.  Feeling guilty.  Trouble making decisions.  Trouble thinking clearly.  Thoughts of suicide or harming others.  Feeling weak.  Feeling agitated.  Keeping yourself from being around other people (isolation). Follow these instructions at home: Activity  Do these things as told by your doctor: ? Go back to your normal activities. ? Exercise regularly. ? Spend time outdoors. Alcohol  Talk with your doctor about how alcohol can affect your antidepressant medicines.  Do not drink alcohol. Or, limit how much alcohol you drink. ? This means no more than 1 drink a day for nonpregnant women and 2 drinks a day for men. One drink equals one of these:  12 oz of beer.  5 oz of wine.  1 oz of hard liquor. General instructions  Take over-the-counter and prescription medicines only as told by your doctor.  Eat a healthy diet.  Get plenty of sleep.  Find activities that you enjoy. Make time to do them.  Think about joining a support group.  Your doctor may be able to suggest a group for you.  Keep all follow-up visits as told by your doctor. This is important. Where to find more information:  [employer] on Mental Illness: ? www.nami.[place]: ? https://carter.com/  National Suicide Prevention Lifeline: ? 743-039-5312. This is free, 24-hour help. Contact a doctor if:  Your symptoms get worse.  You have new symptoms. Get help right away if:  You self-harm.  You see, hear, taste, smell, or feel things that are not present (hallucinate). If you ever feel like you may hurt yourself or others, or have thoughts about taking your own life, get help right away. You can go to your  nearest emergency department or call:  Your local emergency services (911 in the [place]  A suicide crisis helpline, such as the National Suicide Prevention Lifeline: ? 208-572-0821. This is open 24 hours a day. This information is not intended to replace advice given to you by your health care provider. Make sure you discuss any questions you have with your health care provider. Document Released: 03/29/2015 Document Revised: 03/30/2017 Document Reviewed: 01/02/2016 Elsevier Patient Education  2020 [employer].
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
## 2018-11-10 LAB — CBC
Hematocrit: 37.2 % (ref 34.0–46.6)
Hemoglobin: 11.6 g/dL (ref 11.1–15.9)
MCH: 24.3 pg — ABNORMAL LOW (ref 26.6–33.0)
MCHC: 31.2 g/dL — ABNORMAL LOW (ref 31.5–35.7)
MCV: 78 fL — ABNORMAL LOW (ref 79–97)
Platelets: 306 10*3/uL (ref 150–450)
RBC: 4.78 x10E6/uL (ref 3.77–5.28)
RDW: 13.7 % (ref 11.7–15.4)
WBC: 6.5 10*3/uL (ref 3.4–10.8)

## 2018-11-10 LAB — COMPREHENSIVE METABOLIC PANEL
ALT: 6 IU/L (ref 0–32)
AST: 7 IU/L (ref 0–40)
Albumin/Globulin Ratio: 1.9 (ref 1.2–2.2)
Albumin: 4.5 g/dL (ref 3.8–4.8)
Alkaline Phosphatase: 55 IU/L (ref 39–117)
BUN/Creatinine Ratio: 8 — ABNORMAL LOW (ref 9–23)
BUN: 6 mg/dL (ref 6–20)
Bilirubin Total: 0.3 mg/dL (ref 0.0–1.2)
CO2: 21 mmol/L (ref 20–29)
Calcium: 9.6 mg/dL (ref 8.7–10.2)
Chloride: 105 mmol/L (ref 96–106)
Creatinine, Ser: 0.71 mg/dL (ref 0.57–1.00)
GFR calc Af Amer: 124 mL/min/{1.73_m2} (ref >59)
GFR calc non Af Amer: 108 mL/min/{1.73_m2} (ref >59)
Globulin, Total: 2.4 g/dL (ref 1.5–4.5)
Glucose: 82 mg/dL (ref 65–99)
Potassium: 4.4 mmol/L (ref 3.5–5.2)
Sodium: 140 mmol/L (ref 134–144)
Total Protein: 6.9 g/dL (ref 6.0–8.5)

## 2018-11-10 LAB — VITAMIN B1: Thiamine: 66.7 nmol/L (ref 66.5–200.0)

## 2018-11-10 LAB — B12 AND FOLATE PANEL
Folate: 9.2 ng/mL (ref >3.0)
Vitamin B-12: 367 pg/mL (ref 232–1245)

## 2018-11-10 LAB — RPR: RPR Ser Ql: NONREACTIVE

## 2018-11-10 LAB — METHYLMALONIC ACID, SERUM: Methylmalonic Acid: 83 nmol/L (ref 0–378)

## 2018-11-10 LAB — HIV ANTIBODY (ROUTINE TESTING W REFLEX): HIV Screen 4th Generation wRfx: NONREACTIVE

## 2018-11-20 ENCOUNTER — Other Ambulatory Visit: Payer: Self-pay | Admitting: Obstetrics and Gynecology

## 2018-11-20 ENCOUNTER — Encounter: Payer: Self-pay | Admitting: General Practice

## 2020-07-21 ENCOUNTER — Encounter (HOSPITAL_COMMUNITY): Payer: Self-pay

## 2020-07-21 ENCOUNTER — Other Ambulatory Visit: Payer: Self-pay

## 2020-07-21 ENCOUNTER — Emergency Department (HOSPITAL_COMMUNITY)
Admission: EM | Admit: 2020-07-21 | Discharge: 2020-07-21 | Disposition: A | Discharge status: Home / Self Care | Payer: Medicare Other | Attending: Emergency Medicine | Admitting: Emergency Medicine

## 2020-07-21 DIAGNOSIS — M542 Cervicalgia: Secondary | ICD-10-CM | POA: Diagnosis present

## 2020-07-21 DIAGNOSIS — Z5321 Procedure and treatment not carried out due to patient leaving prior to being seen by health care provider: Secondary | ICD-10-CM | POA: Insufficient documentation

## 2020-07-21 NOTE — ED Notes (Signed)
Pt left the lobby and said she was going to a doctors appt

## 2020-07-21 NOTE — ED Triage Notes (Signed)
Patient complains of right sided neck pain that is non-traumatic. Pain and burning with any ROM of neck. NAD

## 2020-10-25 ENCOUNTER — Encounter: Payer: Self-pay | Admitting: Neurology

## 2020-10-25 ENCOUNTER — Institutional Professional Consult (permissible substitution): Payer: Medicare Other | Admitting: Neurology

## 2022-05-10 ENCOUNTER — Emergency Department (HOSPITAL_COMMUNITY)
Admission: EM | Admit: 2022-05-10 | Discharge: 2022-05-10 | Disposition: A | Discharge status: Home / Self Care | Payer: Medicare Other | Attending: Emergency Medicine | Admitting: Emergency Medicine

## 2022-05-10 ENCOUNTER — Emergency Department (HOSPITAL_COMMUNITY): Payer: Medicare Other

## 2022-05-10 ENCOUNTER — Encounter (HOSPITAL_COMMUNITY): Payer: Self-pay

## 2022-05-10 DIAGNOSIS — R0789 Other chest pain: Secondary | ICD-10-CM | POA: Diagnosis present

## 2022-05-10 DIAGNOSIS — M940 Chondrocostal junction syndrome [Tietze]: Secondary | ICD-10-CM | POA: Insufficient documentation

## 2022-05-10 DIAGNOSIS — J40 Bronchitis, not specified as acute or chronic: Secondary | ICD-10-CM | POA: Insufficient documentation

## 2022-05-10 LAB — BASIC METABOLIC PANEL
Anion gap: 6 (ref 5–15)
BUN: 8 mg/dL (ref 6–20)
CO2: 24 mmol/L (ref 22–32)
Calcium: 9.3 mg/dL (ref 8.9–10.3)
Chloride: 107 mmol/L (ref 98–111)
Creatinine, Ser: 0.78 mg/dL (ref 0.44–1.00)
GFR, Estimated: >60 mL/min (ref >60)
Glucose, Bld: 87 mg/dL (ref 70–99)
Potassium: 3.6 mmol/L (ref 3.5–5.1)
Sodium: 137 mmol/L (ref 135–145)

## 2022-05-10 LAB — CBC
HCT: 36.1 % (ref 36.0–46.0)
Hemoglobin: 11.8 g/dL — ABNORMAL LOW (ref 12.0–15.0)
MCH: 25.1 pg — ABNORMAL LOW (ref 26.0–34.0)
MCHC: 32.7 g/dL (ref 30.0–36.0)
MCV: 76.8 fL — ABNORMAL LOW (ref 80.0–100.0)
Platelets: 325 10*3/uL (ref 150–400)
RBC: 4.7 MIL/uL (ref 3.87–5.11)
RDW: 13.8 % (ref 11.5–15.5)
WBC: 10.2 10*3/uL (ref 4.0–10.5)
nRBC: 0 % (ref 0.0–0.2)

## 2022-05-10 LAB — D-DIMER, QUANTITATIVE: D-Dimer, Quant: 0.46 ug/mL-FEU (ref 0.00–0.50)

## 2022-05-10 LAB — TROPONIN I (HIGH SENSITIVITY): Troponin I (High Sensitivity): 2 ng/L (ref <18)

## 2022-05-10 MED ORDER — IBUPROFEN 800 MG PO TABS
800.0000 mg | ORAL_TABLET | Freq: Once | ORAL | Status: AC
  Administered 2022-05-10: 800 mg via ORAL
  Filled 2022-05-10: qty 1

## 2022-05-10 MED ORDER — BENZONATATE 100 MG PO CAPS: 100.0000 mg | ORAL_CAPSULE | Freq: Three times a day (TID) | ORAL | 0 refills | Status: DC

## 2022-05-10 MED ORDER — ALBUTEROL SULFATE HFA 108 (90 BASE) MCG/ACT IN AERS: 1.0000 | INHALATION_SPRAY | Freq: Four times a day (QID) | RESPIRATORY_TRACT | 0 refills | Status: AC | PRN

## 2022-05-10 MED ORDER — LIDOCAINE 5 % EX PTCH: 1.0000 | MEDICATED_PATCH | CUTANEOUS | 0 refills | Status: DC

## 2022-05-10 MED ORDER — PREDNISONE 20 MG PO TABS: 40.0000 mg | ORAL_TABLET | Freq: Every day | ORAL | 0 refills | Status: DC

## 2022-05-10 MED ORDER — LIDOCAINE 5 % EX PTCH
2.0000 | MEDICATED_PATCH | CUTANEOUS | Status: DC
  Administered 2022-05-10: 2 via TRANSDERMAL
  Filled 2022-05-10: qty 2

## 2022-05-10 NOTE — ED Triage Notes (Signed)
Pt arrived via POV, c/o cough, productive white/yellow phlegm. Pain in back, worsening with deep breathing. Also c/o dental pain from tooth extraction today, requesting pain medication.

## 2022-05-10 NOTE — ED Provider Notes (Signed)
[facility]-EMERGENCY DEPT Provider Note   CSN: [id number] Arrival date & time: 05/10/22  1133     History  Chief Complaint  Patient presents with   Back Pain   Cough    Angela Andrade is a 42 y.o. female, history of beta thalassemia, who presents to the ED secondary to left back, left chest discomfort that is been going on for last couple weeks.  She states that she has had a productive cough for the last couple weeks, and has been coughing a lot of yellow phlegm up, and now has chest pain back pain.  Worse with taking deep breaths, does endorse having some shortness of breath..  She states that she also has pain from a tooth that she had pulled today.  Denies any hemoptysis, just yellow mucus.     Home Medications Prior to Admission medications   Medication Sig Start Date End Date Taking? Authorizing Provider  albuterol (VENTOLIN HFA) 108 (90 Base) MCG/ACT inhaler Inhale 1-2 puffs into the lungs every 6 (six) hours as needed for wheezing or shortness of breath. 05/10/22  Yes Slaton Reaser L, PA  benzonatate (TESSALON) 100 MG capsule Take 1 capsule (100 mg total) by mouth every 8 (eight) hours. 05/10/22  Yes Inette Doubrava L, PA  lidocaine (LIDODERM) 5 % Place 1 patch onto the skin daily. Remove & Discard patch within 12 hours or as directed by MD 05/10/22  Yes Verna Desrocher L, PA  predniSONE (DELTASONE) 20 MG tablet Take 2 tablets (40 mg total) by mouth daily. 05/10/22  Yes Oberon Hehir L, PA  cyclobenzaprine (FLEXERIL) 10 MG tablet Take 10 mg by mouth 2 (two) times daily as needed. 10/18/18   [provider]  ibuprofen (ADVIL,MOTRIN) 800 MG tablet Take 1 tablet (800 mg total) by mouth every 8 (eight) hours as needed for cramping. 08/31/17   Cresenzo-Dishmon, Scarlette Calico, CNM  labetalol (NORMODYNE) 200 MG tablet Take 1 tablet (200 mg total) by mouth 2 (two) times daily. Patient not taking: Reported on 11/06/2018 09/14/17   Conan Bowens, MD  metroNIDAZOLE (FLAGYL) 500  MG tablet Take 1 tablet (500 mg total) by mouth 2 (two) times daily. Patient not taking: Reported on 11/06/2018 10/17/17   Conan Bowens, MD  SPRINTEC 28 0.25-35 MG-MCG tablet TAKE 1 TABLET BY MOUTH EVERY DAY 11/20/18   Conan Bowens, MD      Allergies    Hydrocodone, Morphine and related, Tylenol [acetaminophen], and Zyprexa [olanzapine]    Review of Systems   Review of Systems  HENT:  Negative for congestion.   Respiratory:  Positive for cough.   Cardiovascular:  Positive for chest pain. Negative for palpitations.  Musculoskeletal:  Positive for back pain.    Physical Exam Updated Vital Signs BP (!) 144/103 (BP Location: Left Arm)   Pulse 68   Temp 99.3 F (37.4 C) (Oral)   Resp 17   LMP 04/26/2022 (Exact Date)   SpO2 98%  Physical Exam Vitals and nursing note reviewed.  Constitutional:      General: She is not in acute distress.    Appearance: She is well-developed.  HENT:     Head: Normocephalic and atraumatic.  Eyes:     Conjunctiva/sclera: Conjunctivae normal.  Cardiovascular:     Rate and Rhythm: Normal rate and regular rhythm.     Heart sounds: No murmur heard. Pulmonary:     Effort: Pulmonary effort is normal. No respiratory distress.     Breath sounds: Normal  breath sounds.  Chest:     Comments: +TTP of L anterior and posterior chest wall, no rash, no crepitus Abdominal:     Palpations: Abdomen is soft.     Tenderness: There is no abdominal tenderness.  Musculoskeletal:        General: No swelling.     Cervical back: Neck supple.  Skin:    General: Skin is warm and dry.     Capillary Refill: Capillary refill takes less than 2 seconds.  Neurological:     Mental Status: She is alert.  Psychiatric:        Mood and Affect: Mood normal.     ED Results / Procedures / Treatments   Labs (all labs ordered are listed, but only abnormal results are displayed) Labs Reviewed  CBC - Abnormal; Notable for the following components:      Result Value    Hemoglobin 11.8 (*)    MCV 76.8 (*)    MCH 25.1 (*)    All other components within normal limits  BASIC METABOLIC PANEL  D-DIMER, QUANTITATIVE  TROPONIN I (HIGH SENSITIVITY)  TROPONIN I (HIGH SENSITIVITY)    EKG None  Radiology DG Chest 2 View  Result Date: 05/10/2022 CLINICAL DATA:  Chest pain.  Worse with deep breathing EXAM: CHEST - 2 VIEW COMPARISON:  None Available. FINDINGS: Normal mediastinum and cardiac silhouette. Normal pulmonary vasculature. No evidence of effusion, infiltrate, or pneumothorax. No acute bony abnormality. IMPRESSION: Normal chest radiograph Electronically Signed   By: Suzy Bouchard M.D.   On: 05/10/2022 13:44    Procedures Procedures    Medications Ordered in ED Medications  lidocaine (LIDODERM) 5 % 2 patch (2 patches Transdermal Patch Applied 05/10/22 1318)  ibuprofen (ADVIL) tablet 800 mg (800 mg Oral Given 05/10/22 1317)    ED Course/ Medical Decision Making/ A&P                           Medical Decision Making Patient is a 42 year old female, here for shortness of breath chest discomfort, pain worse with taking deep breath.  She states that she was recently ill, and has had a productive cough, has been coughing quite a bit.  Pain is worse when she does take a deep breath or coughs.  She does have tenderness to palpation of her chest wall, however given her symptoms we will obtain a D-dimer, troponins, EKG for further evaluation.  Amount and/or Complexity of Data Reviewed Labs: ordered.    Details: D-dimer less than 500, troponin within normal limits. Radiology: ordered.    Details: Chest x-ray clear. Discussion of management or test interpretation with external provider(s): She denies any alleviating factors, she does have tenderness to palpation of the last left posterior and anterior chest wall, this is suspicious for costochondritis likely secondary to her bronchitis.  Will put her on prednisone, albuterol inhaler and give her Tessalon Perles  to help with cough.  We discussed return precautions, and she voiced understanding.  I encouraged her to use ice, heat, and ibuprofen to help with the pain control. HEART:  1.  She is overall well-appearing nontoxic, and does not have any rubs or murmurs.  Pain is not alleviated with laying or sitting up.  It all appears to be pleuritic, D-dimer was within normal limits for her.  I believe her pain is likely secondary to inflammation of her nerves in her rib cage.   Risk Prescription drug management.   Final Clinical  Impression(s) / ED Diagnoses Final diagnoses:  Costochondritis  Bronchitis  Chest discomfort    Rx / DC Orders ED Discharge Orders          Ordered    benzonatate (TESSALON) 100 MG capsule  Every 8 hours        05/10/22 1534    albuterol (VENTOLIN HFA) 108 (90 Base) MCG/ACT inhaler  Every 6 hours PRN        05/10/22 1534    predniSONE (DELTASONE) 20 MG tablet  Daily        05/10/22 1534    lidocaine (LIDODERM) 5 %  Every 24 hours        05/10/22 1534              Rickell Wiehe, [place], PA 05/10/22 1539    Milton Ferguson, MD 05/13/22 [phone]

## 2022-05-10 NOTE — Discharge Instructions (Addendum)
Please follow-up with your primary care doctor. Use the Tessalon Perles to help with the coughing, you can also take Mucinex to help make your cough easier to cough up things.  Please also take the prednisone during the morning to help reduce your risk of staying up all night.  If you have worsening chest pain, shortness of breath please return to the ER.

## 2022-05-10 NOTE — ED Provider Triage Note (Signed)
Emergency Medicine Provider Triage Evaluation Note  Angela Andrade , a 42 y.o. female  was evaluated in triage.  Pt complains of back pain and SOB and chest pain with deep breaths. Recent URI. No hemoptysis, just mucus. Coughing for the last month. Past smoking hx. No BT.   Review of Systems  Positive: Back pain Negative: hemoptysis  Physical Exam  BP (!) 144/103 (BP Location: Left Arm)   Pulse 68   Temp 99.3 F (37.4 C) (Oral)   Resp 17   SpO2 98%  Gen:   Awake, no distress   Resp:  Normal effort  MSK:   Moves extremities without difficulty  Other:  TTP of L chest wall and back  Medical Decision Making  Medically screening exam initiated at 12:42 PM.  Appropriate orders placed.  Angela Andrade was informed that the remainder of the evaluation will be completed by another provider, this initial triage assessment does not replace that evaluation, and the importance of remaining in the ED until their evaluation is complete.   Osvaldo Shipper, [place] 05/10/22 1247

## 2024-04-14 NOTE — Progress Notes (Signed)
 Subjective   Patient ID:  Angela Andrade is a 43 y.o. (DOB 06-May-1980) female.   Chief Complaint  Patient presents with   Pain    Neck and back      HPI  Pt presents c/o worsening neck pain in the last several months. Previous x-rays have shown degenerative changes as well as muscle spasms. Pain is described as constant and dull and achy. Certain movements cause sharper pain. Some radiation into the L arm as well as numbness/tingling. She would like to have another MRI. Using CBD with mild relief. Ibuprofen  helps more than anything.  Also would like a referral to counseling - she has a lot of stress with her marriage, children and perimenopause. She would like to have an increase on her clonazepam  - she does not use it daily. She has not been regular with taking the fluoxetine - she uses this more as needed.  Problem List, Past Medical History, Past Surgical History, Past Family History, Social History, Medications, and Allergies, were reviewed and updated as appropriate.   Review of Systems Review of Systems  Constitutional:  Negative for chills, fatigue and fever.  Respiratory:  Negative for cough, chest tightness and shortness of breath.   Cardiovascular:  Negative for chest pain, palpitations and leg swelling.  Gastrointestinal:  Negative for abdominal pain, diarrhea, nausea and vomiting.  Musculoskeletal:  Positive for neck pain and neck stiffness.  Skin:  Negative for rash.  Neurological:  Positive for numbness. Negative for dizziness, syncope, weakness, light-headedness and headaches.  Psychiatric/Behavioral:  Positive for agitation and dysphoric mood. Negative for self-injury and suicidal ideas. The patient is nervous/anxious.      Objective   BP 100/76 (BP Location: Left Upper Arm, Patient Position: Sitting)   Pulse 94   Temp 98.9 F (37.2 C) (Oral)   Resp 18   Ht 5' 4.5 (1.638 m)   Wt 154 lb 9.6 oz (70.1 kg)   LMP 04/11/2024 (Exact Date)   SpO2 98%    Breastfeeding No   BMI 26.13 kg/m   Physical Exam Vitals and nursing note reviewed.  Constitutional:      General: She is not in acute distress.    Appearance: Normal appearance. She is well-developed. She is not ill-appearing or toxic-appearing.  HENT:     Head: Normocephalic and atraumatic.  Cardiovascular:     Rate and Rhythm: Normal rate and regular rhythm.     Heart sounds: Normal heart sounds, S1 normal and S2 normal. No murmur heard.    No friction rub. No gallop.  Musculoskeletal:     Cervical back: Neck supple. Spasms and tenderness present. No swelling, deformity, bony tenderness or crepitus. Pain with movement present. Decreased range of motion (with all movements due to pain and stiffness, especially movements to the L.).  Pulmonary:     Effort: Pulmonary effort is normal.     Breath sounds: Normal breath sounds. No decreased breath sounds, wheezing, rhonchi or rales.  Skin:    General: Skin is warm and dry.     Findings: No rash.  Neurological:     General: No focal deficit present.     Mental Status: She is alert and oriented to person, place, and time.     Sensory: Sensory deficit (decreased sensation along the L arm as compared to the R.) present.     Motor: Motor function is intact. No weakness.     Gait: Gait is intact.     Deep Tendon Reflexes:  Reflex Scores:      Bicep reflexes are 2+ on the right side and 2+ on the left side.      Brachioradialis reflexes are 2+ on the right side and 2+ on the left side. Psychiatric:        Attention and Perception: Attention normal.        Mood and Affect: Mood is anxious. Mood is not depressed. Affect is tearful.        Speech: Speech normal.        Behavior: Behavior normal. Behavior is cooperative.        Thought Content: Thought content normal.        Cognition and Memory: Cognition normal.        Judgment: Judgment normal.    --------------------------- Labs last 12 hours No results found for this or any  previous visit (from the past 12 hours).    Impression   1. Cervical radiculopathy  MRI Spine Cervical WO Contrast    2. Bipolar I disorder, single manic episode (*)  clonazePAM  (KLONOPIN ) 1 mg tablet   Ambulatory referral to Psychotherapy/Counseling       Plan  Reassurance.  CERVICAL RADICULOPATHY: Will order MRI of cervical spine. Encourage regular ROM exercises/gentle stretches. Warm compresses. Ok to continue ibuprofen  prn. Increase fluids. May need referral to specialist based on results.  BIPOLAR D/O: Pt has had some increased stressors recently with her marriage. Will increase clonazepam  to 1mg  prn - she is aware of the potential of this medication to be habit forming and is using it sparingly. Encourage pt to take fluoxetine regularly. Will also place referral for counseling. Lean on support system. Find ways to deal with stress such as exercise, deep breathing, hot baths, journaling, reading, etc.  See encounter after visit summary for patient specific instructions.  I have reviewed the information contained in this note and personally verified its accuracy.  I obtained the history of present illness and personally performed the physical exam.  Patient verbalized to me that they understood what their problem is, what they need to do about it and why it is important that they do it.   Follow up in about 5 months (around 09/12/2024) for - AWV - fasting..    Orders Placed This Encounter  Procedures   MRI Spine Cervical WO Contrast   Ambulatory referral to Psychotherapy/Counseling     Patient's Medications       * Accurate as of April 14, 2024 10:57 AM. Reflects encounter med changes as of last refresh          Continued Medications      Instructions  albuterol  sulfate HFA 108 (90 Base) MCG/ACT inhaler Commonly known as: PROVENTIL ,VENTOLIN ,PROAIR   1-2 puffs, Every 6 hours as needed   ibuprofen  800 mg tablet Commonly known as: ADVIL ,MOTRIN    TAKE ONE TABLET BY MOUTH EVERY 8 HOURS AS NEEDED FOR PAIN.   multivitamin tablet  1 tablet, Oral, Daily       Modified Medications      Instructions  clonazePAM  1 mg tablet Commonly known as: KLONOPIN  What changed:  medication strength additional instructions Changed by: Tylene Meres  Take 0.5 to 1 tablet up to twice a day as needed. Use sparingly.   FLUoxetine 10 mg capsule Commonly known as: PROZAC What changed:  when to take this reasons to take this  10 mg, Oral, Daily        Risks, benefits, and alternatives of the medications and treatment plan prescribed today were  discussed, and patient expressed understanding. Plan follow-up as discussed or as needed if any worsening symptoms or change in condition.    A yearly health maintenance exam was recommended where appropriate.     *Some images could not be shown.

## 2024-05-02 ENCOUNTER — Inpatient Hospital Stay (HOSPITAL_COMMUNITY)
Admission: AD | Admit: 2024-05-02 | Discharge: 2024-05-06 | DRG: 885 | Disposition: A | Discharge status: Home / Self Care | Source: Intra-hospital

## 2024-05-02 ENCOUNTER — Ambulatory Visit (HOSPITAL_COMMUNITY)
Admission: EM | Admit: 2024-05-02 | Discharge: 2024-05-02 | Disposition: A | Discharge status: Home / Self Care | Attending: Psychiatry | Admitting: Psychiatry

## 2024-05-02 ENCOUNTER — Other Ambulatory Visit: Payer: Self-pay

## 2024-05-02 ENCOUNTER — Ambulatory Visit (INDEPENDENT_AMBULATORY_CARE_PROVIDER_SITE_OTHER)
Admission: EM | Admit: 2024-05-02 | Discharge: 2024-05-02 | Disposition: A | Discharge status: Other Acute Inpatient Hospital | Source: Home / Self Care

## 2024-05-02 ENCOUNTER — Encounter (HOSPITAL_COMMUNITY): Payer: Self-pay | Admitting: Psychiatry

## 2024-05-02 DIAGNOSIS — Z604 Social exclusion and rejection: Secondary | ICD-10-CM | POA: Diagnosis present

## 2024-05-02 DIAGNOSIS — Z87820 Personal history of traumatic brain injury: Secondary | ICD-10-CM

## 2024-05-02 DIAGNOSIS — Z91018 Allergy to other foods: Secondary | ICD-10-CM | POA: Diagnosis not present

## 2024-05-02 DIAGNOSIS — Z8249 Family history of ischemic heart disease and other diseases of the circulatory system: Secondary | ICD-10-CM

## 2024-05-02 DIAGNOSIS — Z79899 Other long term (current) drug therapy: Secondary | ICD-10-CM

## 2024-05-02 DIAGNOSIS — R4586 Emotional lability: Secondary | ICD-10-CM | POA: Diagnosis present

## 2024-05-02 DIAGNOSIS — F17210 Nicotine dependence, cigarettes, uncomplicated: Secondary | ICD-10-CM | POA: Diagnosis present

## 2024-05-02 DIAGNOSIS — F3163 Bipolar disorder, current episode mixed, severe, without psychotic features: Secondary | ICD-10-CM | POA: Diagnosis present

## 2024-05-02 DIAGNOSIS — Z833 Family history of diabetes mellitus: Secondary | ICD-10-CM

## 2024-05-02 DIAGNOSIS — G894 Chronic pain syndrome: Secondary | ICD-10-CM | POA: Diagnosis present

## 2024-05-02 DIAGNOSIS — R001 Bradycardia, unspecified: Secondary | ICD-10-CM | POA: Diagnosis not present

## 2024-05-02 DIAGNOSIS — Z886 Allergy status to analgesic agent status: Secondary | ICD-10-CM | POA: Diagnosis not present

## 2024-05-02 DIAGNOSIS — R9431 Abnormal electrocardiogram [ECG] [EKG]: Secondary | ICD-10-CM

## 2024-05-02 DIAGNOSIS — F1290 Cannabis use, unspecified, uncomplicated: Secondary | ICD-10-CM | POA: Diagnosis present

## 2024-05-02 DIAGNOSIS — Z62820 Parent-biological child conflict: Secondary | ICD-10-CM | POA: Diagnosis not present

## 2024-05-02 DIAGNOSIS — F3162 Bipolar disorder, current episode mixed, moderate: Secondary | ICD-10-CM | POA: Insufficient documentation

## 2024-05-02 DIAGNOSIS — Z888 Allergy status to other drugs, medicaments and biological substances status: Secondary | ICD-10-CM

## 2024-05-02 DIAGNOSIS — G9389 Other specified disorders of brain: Secondary | ICD-10-CM | POA: Diagnosis present

## 2024-05-02 DIAGNOSIS — G4700 Insomnia, unspecified: Secondary | ICD-10-CM | POA: Diagnosis present

## 2024-05-02 DIAGNOSIS — S069XAA Unspecified intracranial injury with loss of consciousness status unknown, initial encounter: Secondary | ICD-10-CM | POA: Diagnosis present

## 2024-05-02 DIAGNOSIS — F3160 Bipolar disorder, current episode mixed, unspecified: Secondary | ICD-10-CM

## 2024-05-02 DIAGNOSIS — Z885 Allergy status to narcotic agent status: Secondary | ICD-10-CM | POA: Diagnosis not present

## 2024-05-02 DIAGNOSIS — S069XAS Unspecified intracranial injury with loss of consciousness status unknown, sequela: Secondary | ICD-10-CM | POA: Insufficient documentation

## 2024-05-02 DIAGNOSIS — F419 Anxiety disorder, unspecified: Secondary | ICD-10-CM | POA: Diagnosis present

## 2024-05-02 DIAGNOSIS — F32A Depression, unspecified: Secondary | ICD-10-CM | POA: Diagnosis present

## 2024-05-02 LAB — LIPID PANEL
Cholesterol: 227 mg/dL — ABNORMAL HIGH (ref 0–200)
HDL: 47 mg/dL
LDL Cholesterol: 162 mg/dL — ABNORMAL HIGH (ref 0–99)
Total CHOL/HDL Ratio: 4.8 ratio
Triglycerides: 91 mg/dL
VLDL: 18 mg/dL (ref 0–40)

## 2024-05-02 LAB — POCT URINE DRUG SCREEN - MANUAL ENTRY (I-SCREEN)
POC Amphetamine UR: NOT DETECTED
POC Buprenorphine (BUP): NOT DETECTED
POC Cocaine UR: NOT DETECTED
POC Marijuana UR: POSITIVE — AB
POC Methadone UR: NOT DETECTED
POC Methamphetamine UR: NOT DETECTED
POC Morphine: NOT DETECTED
POC Oxazepam (BZO): POSITIVE — AB
POC Oxycodone UR: NOT DETECTED
POC Secobarbital (BAR): NOT DETECTED

## 2024-05-02 LAB — CBC WITH DIFFERENTIAL/PLATELET
Abs Immature Granulocytes: 0.02 K/uL (ref 0.00–0.07)
Basophils Absolute: 0 K/uL (ref 0.0–0.1)
Basophils Relative: 0 %
Eosinophils Absolute: 0 K/uL (ref 0.0–0.5)
Eosinophils Relative: 1 %
HCT: 36.5 % (ref 36.0–46.0)
Hemoglobin: 12.5 g/dL (ref 12.0–15.0)
Immature Granulocytes: 0 %
Lymphocytes Relative: 31 %
Lymphs Abs: 2.5 K/uL (ref 0.7–4.0)
MCH: 25.8 pg — ABNORMAL LOW (ref 26.0–34.0)
MCHC: 34.2 g/dL (ref 30.0–36.0)
MCV: 75.4 fL — ABNORMAL LOW (ref 80.0–100.0)
Monocytes Absolute: 0.6 K/uL (ref 0.1–1.0)
Monocytes Relative: 7 %
Neutro Abs: 4.8 K/uL (ref 1.7–7.7)
Neutrophils Relative %: 61 %
Platelets: 270 K/uL (ref 150–400)
RBC: 4.84 MIL/uL (ref 3.87–5.11)
RDW: 13.8 % (ref 11.5–15.5)
WBC: 7.9 K/uL (ref 4.0–10.5)
nRBC: 0 % (ref 0.0–0.2)

## 2024-05-02 LAB — COMPREHENSIVE METABOLIC PANEL WITH GFR
ALT: 7 U/L (ref 0–44)
AST: 16 U/L (ref 15–41)
Albumin: 4.8 g/dL (ref 3.5–5.0)
Alkaline Phosphatase: 56 U/L (ref 38–126)
Anion gap: 14 (ref 5–15)
BUN: 9 mg/dL (ref 6–20)
CO2: 20 mmol/L — ABNORMAL LOW (ref 22–32)
Calcium: 10 mg/dL (ref 8.9–10.3)
Chloride: 107 mmol/L (ref 98–111)
Creatinine, Ser: 0.74 mg/dL (ref 0.44–1.00)
GFR, Estimated: >60 mL/min
Glucose, Bld: 93 mg/dL (ref 70–99)
Potassium: 3.6 mmol/L (ref 3.5–5.1)
Sodium: 141 mmol/L (ref 135–145)
Total Bilirubin: 0.6 mg/dL (ref 0.0–1.2)
Total Protein: 7.8 g/dL (ref 6.5–8.1)

## 2024-05-02 LAB — ETHANOL: Alcohol, Ethyl (B): <15 mg/dL

## 2024-05-02 LAB — HEMOGLOBIN A1C
Hgb A1c MFr Bld: 4.9 % (ref 4.8–5.6)
Mean Plasma Glucose: 93.93 mg/dL

## 2024-05-02 LAB — POC URINE PREG, ED: Preg Test, Ur: NEGATIVE

## 2024-05-02 LAB — TSH: TSH: 1.93 u[IU]/mL (ref 0.350–4.500)

## 2024-05-02 MED ORDER — HALOPERIDOL LACTATE 5 MG/ML IJ SOLN: 10.0000 mg | Freq: Three times a day (TID) | INTRAMUSCULAR | Status: DC | PRN

## 2024-05-02 MED ORDER — ALUM & MAG HYDROXIDE-SIMETH 200-200-20 MG/5ML PO SUSP: 30.0000 mL | ORAL | Status: DC | PRN

## 2024-05-02 MED ORDER — LORAZEPAM 2 MG/ML IJ SOLN: 2.0000 mg | Freq: Three times a day (TID) | INTRAMUSCULAR | Status: DC | PRN

## 2024-05-02 MED ORDER — HALOPERIDOL LACTATE 5 MG/ML IJ SOLN: 5.0000 mg | Freq: Three times a day (TID) | INTRAMUSCULAR | Status: DC | PRN

## 2024-05-02 MED ORDER — TRAZODONE HCL 50 MG PO TABS
50.0000 mg | ORAL_TABLET | Freq: Every evening | ORAL | Status: DC | PRN
  Administered 2024-05-03 – 2024-05-05 (×3): 50 mg via ORAL
  Filled 2024-05-02 (×3): qty 1

## 2024-05-02 MED ORDER — DIPHENHYDRAMINE HCL 50 MG/ML IJ SOLN: 50.0000 mg | Freq: Three times a day (TID) | INTRAMUSCULAR | Status: DC | PRN

## 2024-05-02 MED ORDER — NICOTINE 14 MG/24HR TD PT24: 14.0000 mg | MEDICATED_PATCH | Freq: Every day | TRANSDERMAL | Status: DC

## 2024-05-02 MED ORDER — MAGNESIUM HYDROXIDE 400 MG/5ML PO SUSP: 30.0000 mL | Freq: Every day | ORAL | Status: DC | PRN

## 2024-05-02 MED ORDER — HALOPERIDOL 5 MG PO TABS: 5.0000 mg | ORAL_TABLET | Freq: Three times a day (TID) | ORAL | Status: DC | PRN

## 2024-05-02 MED ORDER — NICOTINE 14 MG/24HR TD PT24
14.0000 mg | MEDICATED_PATCH | Freq: Every day | TRANSDERMAL | Status: DC
  Filled 2024-05-02 (×4): qty 1

## 2024-05-02 MED ORDER — HALOPERIDOL 5 MG PO TABS
5.0000 mg | ORAL_TABLET | Freq: Three times a day (TID) | ORAL | Status: DC | PRN
  Administered 2024-05-03: 5 mg via ORAL
  Filled 2024-05-02: qty 1

## 2024-05-02 MED ORDER — HYDROXYZINE HCL 25 MG PO TABS
25.0000 mg | ORAL_TABLET | Freq: Three times a day (TID) | ORAL | Status: DC | PRN
  Administered 2024-05-02: 25 mg via ORAL
  Filled 2024-05-02: qty 1

## 2024-05-02 MED ORDER — DIPHENHYDRAMINE HCL 25 MG PO CAPS
50.0000 mg | ORAL_CAPSULE | Freq: Three times a day (TID) | ORAL | Status: DC | PRN
  Administered 2024-05-03: 50 mg via ORAL
  Filled 2024-05-02: qty 2

## 2024-05-02 MED ORDER — HYDROXYZINE HCL 25 MG PO TABS: 25.0000 mg | ORAL_TABLET | Freq: Three times a day (TID) | ORAL | Status: DC | PRN

## 2024-05-02 MED ORDER — DIPHENHYDRAMINE HCL 50 MG PO CAPS: 50.0000 mg | ORAL_CAPSULE | Freq: Three times a day (TID) | ORAL | Status: DC | PRN

## 2024-05-02 MED ORDER — CLONAZEPAM 0.5 MG PO TABS: 0.5000 mg | ORAL_TABLET | Freq: Two times a day (BID) | ORAL | Status: DC | PRN

## 2024-05-02 NOTE — ED Notes (Signed)
 Pt is feeling anxious.  Asking multiple questions, wondering about the unit, her labs and d/c plans.  Questions answered for her and she was provided with a sandwhich and some juice to eat.  Medicated with vistaril  25 mg po for anxiety

## 2024-05-02 NOTE — ED Notes (Signed)
 Patient discharged by provider.
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
## 2024-05-02 NOTE — ED Notes (Signed)
 Labs sent to [facility] one red, one dark green, one light green, one yellow, two lavender, with labels for patient on tubes and Order Requisition for patient.
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
## 2024-05-02 NOTE — Progress Notes (Signed)
 Pt has been accepted to [facility] on 05/02/2024 . Bed assignment:304-2   Pt meets inpatient criteria per Ardelle Blush, NP   Attending Physician will be Dr. Raliegh.   Report can be called to: - Adult unit: [phone]  Pt can arrive pending labs   Care Team Notified: [facility] Cherylynn Ernst, RN, Asberry Caro, RN, Ardelle Blush, NP
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
## 2024-05-02 NOTE — Discharge Instructions (Addendum)
 You are encouraged to follow up with [facility] for outpatient treatment.  Walk in/ Open Access Hours: Monday - Friday 8AM - 11AM (To see provider and therapist) Friday - 1PM - 4PM (To see therapist only)  [facility] [address] [place], [place] [phone] recommendations:   Medications: Patient is to take medications as prescribed. The patient or patient's guardian is to contact a medical professional and/or outpatient provider to address any new side effects that develop. The patient or the patient's guardian should update outpatient providers of any new medications and/or medication changes.    Outpatient Follow up: Please review list of outpatient resources for psychiatry and counseling. Please follow up with your primary care provider for all medical related needs.    Therapy: We recommend that patient participate in individual therapy to address mental health concerns.   Atypical antipsychotics: If you are prescribed an atypical antipsychotic, it is recommended that your height, weight, BMI, blood pressure, fasting lipid panel, and fasting blood sugar be monitored by your outpatient providers.  Safety:   The following safety precautions should be taken:   No sharp objects. This includes scissors, razors, scrapers, and putty knives.   Chemicals should be removed and locked up.   Medications should be removed and locked up.   Weapons should be removed and locked up. This includes firearms, knives and instruments that can be used to cause injury.   The patient should abstain from use of illicit substances/drugs and abuse of any medications.  If symptoms worsen or do not continue to improve or if the patient becomes actively suicidal or homicidal then it is recommended that the patient return to the closest hospital emergency department, the [facility], or call 911 for further evaluation and  treatment. National Suicide Prevention Lifeline 1-800-SUICIDE or 914-616-6081.  About 988 988 offers 24/7 access to trained crisis counselors who can help people experiencing mental health-related distress. People can call or text 988 or chat 988lifeline.org for themselves or if they are worried about a loved one who may need crisis support.

## 2024-05-02 NOTE — [facility] Group Notes (Signed)
 Psychoeducational Group Note  Date:  05/02/2024 Time:  2000  Group Topic/Focus:  Alcoholics Anonymous Meeting  Participation Level: Did Not Attend  Participation Quality:  Not Applicable  Affect:  Not Applicable  Cognitive:  Not Applicable  Insight:  Not Applicable  Engagement in Group: Not Applicable  Additional Comments:  Did not attend.   Lenora Manuelita RAMAN 05/02/2024, 8:56 PM

## 2024-05-02 NOTE — ED Notes (Signed)
 Pt is waiting on transportation for after shift change at [facility].   Report has been called and EMTALA is complete.  Pt is doing well on the unit, interacting with another patient on the unit.

## 2024-05-02 NOTE — ED Notes (Signed)
 Awaiting on lab results to transfer pt to [facility].  She remains calm and cooperative.

## 2024-05-02 NOTE — Discharge Instructions (Addendum)
Pt being transferred to [facility]
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
## 2024-05-03 ENCOUNTER — Inpatient Hospital Stay (HOSPITAL_COMMUNITY): Payer: Self-pay

## 2024-05-03 MED ORDER — QUETIAPINE FUMARATE ER 300 MG PO TB24: 300.0000 mg | ORAL_TABLET | Freq: Every day | ORAL | Status: DC

## 2024-05-03 MED ORDER — QUETIAPINE FUMARATE 50 MG PO TABS
150.0000 mg | ORAL_TABLET | Freq: Two times a day (BID) | ORAL | Status: DC
  Administered 2024-05-03 – 2024-05-06 (×6): 150 mg via ORAL
  Filled 2024-05-03 (×6): qty 1

## 2024-05-03 MED ORDER — DIVALPROEX SODIUM 125 MG PO CSDR
625.0000 mg | DELAYED_RELEASE_CAPSULE | Freq: Two times a day (BID) | ORAL | Status: DC
  Administered 2024-05-03 – 2024-05-06 (×7): 625 mg via ORAL
  Filled 2024-05-03 (×7): qty 5

## 2024-05-03 NOTE — Progress Notes (Signed)
(  Sleep Hours) - 1.5 (Any PRNs that were needed, meds refused, or side effects to meds)- None (Any disturbances and when (visitation, over night)- None (Concerns raised by the patient)- None (SI/HI/AVH)- Denied
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
## 2024-05-03 NOTE — [facility] Group Notes (Signed)
 [facility] Group Notes:  (Nursing/MHT/Case Management/Adjunct)  Date:  05/03/2024  Time:  8:43 PM  Type of Therapy:  Psychoeducational Skills  Participation Level:  Minimal  Participation Quality:  Attentive  Affect:  Flat  Cognitive:  Appropriate  Insight:  Limited  Engagement in Group:  Developing/Improving  Modes of Intervention:  Education  Summary of Progress/Problems: The patient rated her day as a 7 or 8 out of a possible 10. She mentioned that she had an eventful day but did not go into further detail. Her goal for today was to work on her treatment plan which she accomplished.   Angela Andrade S 05/03/2024, 8:43 PM
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
## 2024-05-03 NOTE — Progress Notes (Signed)
" °   05/03/24 2030  Psych Admission Type (Psych Patients Only)  Admission Status Voluntary  Psychosocial Assessment  Patient Complaints None  Eye Contact Fair  Facial Expression Animated  Affect Appropriate to circumstance  Speech Logical/coherent  Interaction Assertive  Motor Activity Other (Comment) (wnl)  Appearance/Hygiene Unremarkable  Behavior Characteristics Appropriate to situation  Mood Pleasant;Preoccupied  Thought Process  Coherency Circumstantial  Content WDL  Delusions None reported or observed  Perception WDL  Hallucination None reported or observed  Judgment Impaired  Confusion None  Danger to Self  Current suicidal ideation? Denies    "
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
## 2024-05-03 NOTE — Group Note (Signed)
 Date:  05/03/2024 Time:  11:48 AM  Group Topic/Focus: Social wellness    Mental health and social wellness are deeply linked: social wellness is about building and maintaining healthy, supportive relationships, which directly boosts mental health by providing emotional support, reducing stress, increasing self-esteem, and fostering a sense of belonging, while loneliness and poor social connections increase risks for anxiety and depression. Key to this are clear communication, setting boundaries, empathy, and actively connecting with others, creating a positive cycle for overall well-being   Participation Level:  Did Not Attend   Angela Andrade 05/03/2024, 11:48 AM

## 2024-05-03 NOTE — Progress Notes (Signed)
 9084 Patient received PO agitation protocol per [facility] due to intimidating and argumentative behavior in the dayroom during group. Patient was calm and cooperative during med administration. 15 minute safety checks ongoing.

## 2024-05-03 NOTE — Progress Notes (Signed)
" °   05/03/24 0900  Psych Admission Type (Psych Patients Only)  Admission Status Voluntary  Psychosocial Assessment  Patient Complaints Anxiety;Irritability  Eye Contact Fair  Facial Expression Animated  Affect Anxious  Speech Logical/coherent  Interaction Assertive  Motor Activity Other (Comment) (WNL)  Appearance/Hygiene Unremarkable  Behavior Characteristics Impulsive;Irritable;Agitated  Mood Labile;Anxious;Preoccupied  Thought Ship Broker  Content WDL  Delusions None reported or observed  Perception WDL  Hallucination None reported or observed  Judgment Impaired  Confusion None  Danger to Self  Current suicidal ideation? Denies  Agreement Not to Harm Self Yes  Description of Agreement verbal  Danger to Others  Danger to Others None reported or observed    "

## 2024-05-03 NOTE — Group Note (Signed)
 Date:  05/03/2024 Time:  6:29 PM  Group Topic/Focus:  Dimensions of Wellness:   The focus of this group is to introduce the topic of wellness and discuss the role each dimension of wellness plays in total health.  This group focused on identifying healthy boundaries.   Participation Level:  Did Not Attend    Angela Andrade 05/03/2024, 6:29 PM

## 2024-05-03 NOTE — Plan of Care (Signed)
  Problem: Education: Goal: Emotional status will improve Outcome: Progressing Goal: Verbalization of understanding the information provided will improve Outcome: Progressing   Problem: Activity: Goal: Interest or engagement in activities will improve Outcome: Progressing Goal: Sleeping patterns will improve Outcome: Progressing   Problem: Health Behavior/Discharge Planning: Goal: Identification of resources available to assist in meeting health care needs will improve Outcome: Progressing

## 2024-05-03 NOTE — Group Note (Signed)
 Date:  05/03/2024 Time:  10:54 AM  Group Topic/Focus: Goals and orientation Goals Group:   The focus of this group is to help patients establish daily goals to achieve during treatment and discuss how the patient can incorporate goal setting into their daily lives to aide in recovery. Orientation:   The focus of this group is to educate the patient on the purpose and policies of crisis stabilization and provide a format to answer questions about their admission.  The group details unit policies and expectations of patients while admitted.    Participation Level:  Minimal  Participation Quality:  Intrusive  Affect:  Anxious and Irritable  Cognitive:  Alert  Insight: None  Engagement in Group:  None  Modes of Intervention:  Orientation  Additional Comments:  Patient was in for a few mins then things were difficult and got upset with others and left   [employer] 05/03/2024, 10:54 AM
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
## 2024-05-04 DIAGNOSIS — F3162 Bipolar disorder, current episode mixed, moderate: Secondary | ICD-10-CM

## 2024-05-04 NOTE — Group Note (Signed)
 Date:  05/04/2024 Time:  4:44 PM  Group Topic/Focus:  Dimensions of Wellness:   The focus of this group is to introduce the topic of wellness using collage. Patients created intuitive collages: serving as a creative outlet for expressing emotions, reducing anxiety, fostering group cohesion and enabling personal insight and healing.     Participation Level:  Active  Participation Quality:  Appropriate  Affect:  Appropriate  Cognitive:  Alert and Appropriate  Insight: Appropriate and Good  Engagement in Group:  Engaged  Modes of Intervention:  Activity, Discussion, Exploration, Rapport Building, Socialization, and Support  Additional Comments:    Angela Andrade 05/04/2024, 4:44 PM

## 2024-05-04 NOTE — Group Note (Signed)
 Date:  05/04/2024 Time:  3:40 PM  Group Topic/Focus: Social Wellness Patients participated in a movie trivia activity designed to facilitate social interaction and cognitive engagement. The exercise promoted collective brainstorming and creative expression within a therapeutic group framework.    Participation Level:  Active  Participation Quality:  Appropriate and Attentive  Affect:  Appropriate  Cognitive:  Alert and Appropriate  Insight: Good and Improving  Engagement in Group:  Engaged and Improving  Modes of Intervention:  Activity, Discussion, Problem-solving, and Socialization  Additional Comments:  Pt attended and actively participated in this group  Kristi CHRISTELLA Plaza 05/04/2024, 3:40 PM

## 2024-05-04 NOTE — BHH Group Notes (Signed)
 Sherill attended the CSW group 05/04/24

## 2024-05-04 NOTE — Group Note (Signed)
 Date:  05/04/2024 Time:  9:25 AM  Group Topic/Focus: Goals group  Following an initial icebreaker and introductions, patients engaged in a structured goal-setting activity. Utilizing SMART goal worksheets, participants identified and disclosed personal objectives related to recovery maintenance and self-care strategies.    Participation Level:  Active  Participation Quality:  Attentive, Intrusive, and Monopolizing  Affect:  Excited  Cognitive:  Alert and Appropriate  Insight: Good and Improving  Engagement in Group:  Engaged, Improving, and Monopolizing  Modes of Intervention:  Discussion, Exploration, Socialization, and Support  Additional Comments:  Pt attended and actively participated in goals group  Kristi HERO [facility] 05/04/2024, 9:25 AM

## 2024-05-04 NOTE — Progress Notes (Signed)
(  Sleep Hours) -6.5 (Any PRNs that were needed, meds refused, or side effects to meds)- trazodone  (Any disturbances and when (visitation, over night)-none (Concerns raised by the patient)- concerned  about test result (SI/HI/AVH)- denies all

## 2024-05-04 NOTE — Group Note (Signed)
"                                                 [facility] LCSW Group Therapy Note    Group Date: 05/04/2024 Start Time: 0940 End Time: 1030  Type of Therapy and Topic:  Group Therapy:  Overcoming Obstacles  Participation Level:  BHH PARTICIPATION LEVEL: Active  Mood:  Description of Group:   In this group patients will be encouraged to explore what they see as obstacles to their own wellness and recovery. They will be guided to discuss their thoughts, feelings, and behaviors related to these obstacles. The group will process together ways to cope with barriers, with attention given to specific choices patients can make. Each patient will be challenged to identify changes they are motivated to make in order to overcome their obstacles. This group will be process-oriented, with patients participating in exploration of their own experiences as well as giving and receiving support and challenge from other group members.  Therapeutic Goals: 1. Patient will identify personal and current obstacles as they relate to admission. 2. Patient will identify barriers that currently interfere with their wellness or overcoming obstacles.  3. Patient will identify feelings, thought process and behaviors related to these barriers. 4. Patient will identify two changes they are willing to make to overcome these obstacles:    Summary of Patient Progress   Pt was present and respectful during group, pt was also able to identify feelings, thought process and behaviors pertaining to their wellness or overcoming obstacles.   Therapeutic Modalities:   Cognitive Behavioral Therapy Solution Focused Therapy Motivational Interviewing Relapse Prevention Therapy   Golda Louder, LCSW "

## 2024-05-04 NOTE — Plan of Care (Signed)
  Problem: Activity: Goal: Sleeping patterns will improve Outcome: Progressing   

## 2024-05-04 NOTE — Progress Notes (Signed)
 [facility] Inpatient Psychiatry Progress Note  Date: 05/04/2024 Patient: Angela Andrade MRN: [id number]  Assessment and Plan: Angela Andrade is a 44 y.o. female admitted voluntarily for mood lability with a mixed-state bipolar presentation.   She was started on Seroquel  and Depakote , and has responded extremely well after the first night and doses of medicine. Her pattern has been to have a short fuse, so further monitoring is warranted in her case as she interacts with others on the milieu, but no medication changes today. 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   # Bipolar 1 disorder, mixed, severe (HCC) - Continue Depakote  625mg  BID (sprinkles requested) - Continue Seroquel  150mg  BID  - Will not prescribe Klonopin  at discharge.   Psychiatry General PRNs  -- Trazodone  50 mg once nightly as needed for insomnia  -- Hydroxyzine  25 mg 3 times daily as needed for anxiety  -- Agitation protocol: Haldol , Benadryl , lorazepam   Medical Issues Being Addressed:  # Headache  - PRN Tylenol , see below   Medical General PRNs  - Tylenol  tablets 650 mg every 6 hours as needed for pain - Maalox/Mylanta suspension 30 mL every 4 hours as needed for indigestion - Milk of Magnesia 30 mL daily as needed for constipation  Risk Assessment - Pt does not appear to be a risk to self at this moment, though further monitoring is warranted to assess efficacy of medications.  Discharge Planning Barriers to discharge: Mood Stability Estimated length of stay: 1/6 or 1/7 Predicted Discharge location: Home    Interval History and update:   05/04/2024 No significant events overnight. Vital Signs are Stable. MAR was reviewed and patient was compliant with medications yesterday. They received Trazodone  PRN medications yesterday. They spent 80% of their time in the  bedroom over the last 24 hours. Case was discussed in the multidisciplinary team.   On interview patient reports she is doing much better. Denies SI, HI, AH, VH.  Reports that she slept from 10 PM to 6 AM overnight and she feels rested.  Discussed plan with patient to continue to monitor for now to which they were agreeable. Pt questions were answered.    Physical Exam MSK/Neuro - Normal gait and station   Mental Status Exam  Apperance: Appropriate for environment and Sitting upright Behavior: Calm Speech: Normal Rate, Articulate, Normal Volume, and Responsive Attitude: Cooperative and Friendly Mood: good Affect: Euthymic, Normal Range, and Mood Congruent Perception: Not responding to internal stimuli Thought Content: within normal limits Thought Form: Goal Directed, Organized, Linear, and Logical Cognition: Alert & Oriented to person, place, and time Judgment: Fair Insight: Fair      Lab Results:  No visits with results within 1 Day(s) from this visit.  Latest known visit with results is:  Admission on 05/02/2024, Discharged on 05/02/2024  Component Date Value Ref Range Status   WBC 05/02/2024 7.9  4.0 - 10.5 K/uL Final   RBC 05/02/2024 4.84  3.87 - 5.11 MIL/uL Final   Hemoglobin 05/02/2024 12.5  12.0 - 15.0 g/dL Final   HCT 98/97/7973 36.5  36.0 - 46.0 % Final   MCV 05/02/2024 75.4 (L)  80.0 - 100.0 fL Final   MCH 05/02/2024 25.8 (L)  26.0 - 34.0 pg Final   MCHC 05/02/2024 34.2  30.0 - 36.0 g/dL Final   RDW 98/97/7973 13.8  11.5 -  15.5 % Final   Platelets 05/02/2024 270  150 - 400 K/uL Final   nRBC 05/02/2024 0.0  0.0 - 0.2 % Final   Neutrophils Relative % 05/02/2024 61  % Final   Neutro Abs 05/02/2024 4.8  1.7 - 7.7 K/uL Final   Lymphocytes Relative 05/02/2024 31  % Final   Lymphs Abs 05/02/2024 2.5  0.7 - 4.0 K/uL Final   Monocytes Relative 05/02/2024 7  % Final   Monocytes Absolute 05/02/2024 0.6  0.1 - 1.0 K/uL Final   Eosinophils Relative 05/02/2024 1  % Final    Eosinophils Absolute 05/02/2024 0.0  0.0 - 0.5 K/uL Final   Basophils Relative 05/02/2024 0  % Final   Basophils Absolute 05/02/2024 0.0  0.0 - 0.1 K/uL Final   Immature Granulocytes 05/02/2024 0  % Final   Abs Immature Granulocytes 05/02/2024 0.02  0.00 - 0.07 K/uL Final   Sodium 05/02/2024 141  135 - 145 mmol/L Final   Potassium 05/02/2024 3.6  3.5 - 5.1 mmol/L Final   Chloride 05/02/2024 107  98 - 111 mmol/L Final   CO2 05/02/2024 20 (L)  22 - 32 mmol/L Final   Glucose, Bld 05/02/2024 93  70 - 99 mg/dL Final   BUN 98/97/7973 9  6 - 20 mg/dL Final   Creatinine, Ser 05/02/2024 0.74  0.44 - 1.00 mg/dL Final   Calcium 98/97/7973 10.0  8.9 - 10.3 mg/dL Final   Total Protein 98/97/7973 7.8  6.5 - 8.1 g/dL Final   Albumin 98/97/7973 4.8  3.5 - 5.0 g/dL Final   AST 98/97/7973 16  15 - 41 U/L Final   ALT 05/02/2024 7  0 - 44 U/L Final   Alkaline Phosphatase 05/02/2024 56  38 - 126 U/L Final   Total Bilirubin 05/02/2024 0.6  0.0 - 1.2 mg/dL Final   GFR, Estimated 05/02/2024 >60  >60 mL/min Final   Anion gap 05/02/2024 14  5 - 15 Final   Hgb A1c MFr Bld 05/02/2024 4.9  4.8 - 5.6 % Final   Mean Plasma Glucose 05/02/2024 93.93  mg/dL Final   Alcohol, Ethyl (B) 05/02/2024 <15  <15 mg/dL Final   Cholesterol 98/97/7973 227 (H)  0 - 200 mg/dL Final   Triglycerides 98/97/7973 91  <150 mg/dL Final   HDL 98/97/7973 47  >40 mg/dL Final   Total CHOL/HDL Ratio 05/02/2024 4.8  RATIO Final   VLDL 05/02/2024 18  0 - 40 mg/dL Final   LDL Cholesterol 05/02/2024 162 (H)  0 - 99 mg/dL Final   TSH 98/97/7973 1.930  0.350 - 4.500 uIU/mL Final   Preg Test, Ur 05/02/2024 Negative  Negative Final   POC Amphetamine UR 05/02/2024 None Detected  NONE DETECTED (Cut Off Level 1000 ng/mL) Final   POC Secobarbital (BAR) 05/02/2024 None Detected  NONE DETECTED (Cut Off Level 300 ng/mL) Final   POC Buprenorphine (BUP) 05/02/2024 None Detected  NONE DETECTED (Cut Off Level 10 ng/mL) Final   POC Oxazepam (BZO) 05/02/2024  Positive (A)  NONE DETECTED (Cut Off Level 300 ng/mL) Final   POC Cocaine UR 05/02/2024 None Detected  NONE DETECTED (Cut Off Level 300 ng/mL) Final   POC Methamphetamine UR 05/02/2024 None Detected  NONE DETECTED (Cut Off Level 1000 ng/mL) Final   POC Morphine  05/02/2024 None Detected  NONE DETECTED (Cut Off Level 300 ng/mL) Final   POC Methadone UR 05/02/2024 None Detected  NONE DETECTED (Cut Off Level 300 ng/mL) Final   POC Oxycodone  UR 05/02/2024 None Detected  NONE DETECTED (Cut Off Level 100 ng/mL) Final   POC Marijuana UR 05/02/2024 Positive (A)  NONE DETECTED (Cut Off Level 50 ng/mL) Final     Vitals: Blood pressure (!) 112/56, pulse 66, temperature 98.6 F (37 C), temperature source Oral, resp. rate 20, height 5' 4 (1.626 m), weight 67.1 kg, SpO2 100%.    Penne Mori, DO, PGY1 [facility]iat[facility]

## 2024-05-04 NOTE — Group Note (Signed)
 Date:  05/04/2024 Time:  9:38 PM  Group Topic/Focus:  Wrap-Up Group:   The focus of this group is to help patients review their daily goal of treatment and discuss progress on daily workbooks.    Participation Level:  Active  Participation Quality:  Appropriate  Affect:  Appropriate  Cognitive:  Appropriate  Insight: Appropriate  Engagement in Group:  Engaged  Modes of Intervention:  Discussion  Additional Comments:   Pt states that she's had a good day and was able to speak with her husband. Pt participated in programming and activities. Pt denied everything   Joleena Weisenburger A Shonte Soderlund 05/04/2024, 9:38 PM

## 2024-05-04 NOTE — Plan of Care (Signed)
   Problem: Education: Goal: Knowledge of [place] [facility] Education information/materials will improve Outcome: Progressing Goal: Emotional status will improve Outcome: Progressing Goal: Mental status will improve Outcome: Progressing Goal: Verbalization of understanding the information provided will improve Outcome: Progressing

## 2024-05-04 NOTE — Progress Notes (Addendum)
 Pt denies SI/HI/AVH this morning. Pt reports that she slept great. Pt calm , pleasant, and cooperative today.    05/04/24 0855  Psych Admission Type (Psych Patients Only)  Admission Status Voluntary  Psychosocial Assessment  Patient Complaints None  Eye Contact Fair  Facial Expression Animated  Affect Appropriate to circumstance  Speech Logical/coherent  Interaction Assertive  Motor Activity Other (Comment) (WDL)  Appearance/Hygiene Unremarkable  Behavior Characteristics Appropriate to situation  Mood Pleasant  Thought Process  Coherency WDL  Content WDL  Delusions None reported or observed  Perception WDL  Hallucination None reported or observed  Judgment Impaired  Confusion None  Danger to Self  Current suicidal ideation? Denies  Agreement Not to Harm Self Yes  Description of Agreement Verbal  Danger to Others  Danger to Others None reported or observed

## 2024-05-05 ENCOUNTER — Encounter (HOSPITAL_COMMUNITY): Payer: Self-pay

## 2024-05-05 DIAGNOSIS — F3163 Bipolar disorder, current episode mixed, severe, without psychotic features: Principal | ICD-10-CM

## 2024-05-05 NOTE — Progress Notes (Signed)
" °   05/04/24 2040  Psych Admission Type (Psych Patients Only)  Admission Status Voluntary  Psychosocial Assessment  Eye Contact Fair  Facial Expression Animated  Affect Appropriate to circumstance  Speech Logical/coherent  Interaction Assertive  Motor Activity Other (Comment) (wnl)  Appearance/Hygiene Unremarkable  Behavior Characteristics Cooperative;Appropriate to situation  Mood Pleasant  Thought Process  Coherency Circumstantial  Content WDL  Delusions None reported or observed  Perception WDL  Hallucination None reported or observed  Judgment Impaired  Confusion None  Danger to Self  Current suicidal ideation? Denies    "

## 2024-05-05 NOTE — Progress Notes (Signed)
(  Sleep Hours) -5.75  (Any PRNs that were needed, meds refused, or side effects to meds)- hydroxyzine  (Any disturbances and when (visitation, over night)-none (Concerns raised by the patient)-  (SI/HI/AVH)-denies all

## 2024-05-05 NOTE — [facility] Group Notes (Signed)
 Adult Psychoeducational Group Note  Date:  05/05/2024 Time:  9:16 PM  Group Topic/Focus:  Wrap-Up Group:   The focus of this group is to help patients review their daily goal of treatment and discuss progress on daily workbooks.  Participation Level:  Did Not Attend  Angela Andrade 05/05/2024, 9:16 PM

## 2024-05-05 NOTE — Plan of Care (Signed)
   Problem: Activity: Goal: Interest or engagement in activities will improve Outcome: Progressing

## 2024-05-05 NOTE — Progress Notes (Signed)
 Pt did not attend grief and loss grief group

## 2024-05-05 NOTE — Progress Notes (Signed)
 [facility] Inpatient Psychiatry Progress Note  Date: 05/05/2024 Patient: Angela Andrade MRN: [id number]  Assessment and Plan: Angela Andrade is a 44 y.o. female admitted voluntarily for mood lability with a mixed-state bipolar presentation.   She was started on Seroquel  and Depakote , and has responded extremely well after the first night and doses of medicine. Her pattern has been to have a short fuse, so further monitoring is warranted in her case as she interacts with others on the milieu, but no medication changes today. 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  She continues to be stable on the current medication regiment, planning on DC tomorrow.  # Bipolar 1 disorder, mixed, severe (HCC) - Continue Depakote  625mg  BID (sprinkles requested) - Continue Seroquel  150mg  BID  - Will not prescribe Klonopin  at discharge.   Psychiatry General PRNs  -- Trazodone  50 mg once nightly as needed for insomnia  -- Hydroxyzine  25 mg 3 times daily as needed for anxiety  -- Agitation protocol: Haldol , Benadryl , lorazepam   Medical Issues Being Addressed:  # Headache  - PRN Tylenol , see below   Medical General PRNs  - Tylenol  tablets 650 mg every 6 hours as needed for pain - Maalox/Mylanta suspension 30 mL every 4 hours as needed for indigestion - Milk of Magnesia 30 mL daily as needed for constipation  Risk Assessment - Pt does not appear to be a risk to self at this moment, though further monitoring is warranted to assess efficacy of medications.  Discharge Planning Barriers to discharge: Mood Stability Estimated length of stay: 1/6 or 1/7 Predicted Discharge location: Home   Interval History and update:   05/05/2024 No significant events overnight. Vital Signs are Stable. MAR was reviewed and patient was compliant with medications yesterday.  They received  PRN Trazodone  yesterday. They spent 52% of their time in the bedroom over the last 24 hours. Case was discussed in the multidisciplinary team.   On interview patient reports that she is feeling much better and denies SI, HI, AH, VH. She has not noticed any physical side effects from the medications, and she feels comfortable enough to go home.  Discussed plan with patient to monitor for another day to which they were agreeable. Pt questions were answered.    Physical Exam MSK/Neuro - Normal gait and station   Mental Status Exam  Apperance: Appropriate for environment and Sitting upright Behavior: Calm Speech: Fast (not pressured), Articulate, Normal Volume, and Responsive Attitude: Cooperative and Friendly Mood: much better Affect: Euthymic, Normal Range, and Mood Congruent Perception: Not responding to internal stimuli Thought Content: within normal limits Thought Form: Goal Directed, Organized, Linear, and Logical Cognition: Alert & Oriented to person, place, and time, Recent and Remote memory grossly intact by recounting personal history, and Immediate memory grossly intact by interview Judgment: Fair Insight: Fair     Lab Results:  No visits with results within 1 Day(s) from this visit.  Latest known visit with results is:  Admission on 05/02/2024, Discharged on 05/02/2024  Component Date Value Ref Range Status   WBC 05/02/2024 7.9  4.0 - 10.5 K/uL Final   RBC 05/02/2024 4.84  3.87 - 5.11 MIL/uL Final   Hemoglobin 05/02/2024 12.5  12.0 - 15.0 g/dL Final   HCT 98/97/7973 36.5  36.0 - 46.0 % Final   MCV 05/02/2024 75.4 (L)  80.0 -  100.0 fL Final   MCH 05/02/2024 25.8 (L)  26.0 - 34.0 pg Final   MCHC 05/02/2024 34.2  30.0 - 36.0 g/dL Final   RDW 98/97/7973 13.8  11.5 - 15.5 % Final   Platelets 05/02/2024 270  150 - 400 K/uL Final   nRBC 05/02/2024 0.0  0.0 - 0.2 % Final   Neutrophils Relative % 05/02/2024 61  % Final   Neutro Abs 05/02/2024 4.8  1.7 - 7.7  K/uL Final   Lymphocytes Relative 05/02/2024 31  % Final   Lymphs Abs 05/02/2024 2.5  0.7 - 4.0 K/uL Final   Monocytes Relative 05/02/2024 7  % Final   Monocytes Absolute 05/02/2024 0.6  0.1 - 1.0 K/uL Final   Eosinophils Relative 05/02/2024 1  % Final   Eosinophils Absolute 05/02/2024 0.0  0.0 - 0.5 K/uL Final   Basophils Relative 05/02/2024 0  % Final   Basophils Absolute 05/02/2024 0.0  0.0 - 0.1 K/uL Final   Immature Granulocytes 05/02/2024 0  % Final   Abs Immature Granulocytes 05/02/2024 0.02  0.00 - 0.07 K/uL Final   Sodium 05/02/2024 141  135 - 145 mmol/L Final   Potassium 05/02/2024 3.6  3.5 - 5.1 mmol/L Final   Chloride 05/02/2024 107  98 - 111 mmol/L Final   CO2 05/02/2024 20 (L)  22 - 32 mmol/L Final   Glucose, Bld 05/02/2024 93  70 - 99 mg/dL Final   BUN 98/97/7973 9  6 - 20 mg/dL Final   Creatinine, Ser 05/02/2024 0.74  0.44 - 1.00 mg/dL Final   Calcium 98/97/7973 10.0  8.9 - 10.3 mg/dL Final   Total Protein 98/97/7973 7.8  6.5 - 8.1 g/dL Final   Albumin 98/97/7973 4.8  3.5 - 5.0 g/dL Final   AST 98/97/7973 16  15 - 41 U/L Final   ALT 05/02/2024 7  0 - 44 U/L Final   Alkaline Phosphatase 05/02/2024 56  38 - 126 U/L Final   Total Bilirubin 05/02/2024 0.6  0.0 - 1.2 mg/dL Final   GFR, Estimated 05/02/2024 >60  >60 mL/min Final   Anion gap 05/02/2024 14  5 - 15 Final   Hgb A1c MFr Bld 05/02/2024 4.9  4.8 - 5.6 % Final   Mean Plasma Glucose 05/02/2024 93.93  mg/dL Final   Alcohol, Ethyl (B) 05/02/2024 <15  <15 mg/dL Final   Cholesterol 98/97/7973 227 (H)  0 - 200 mg/dL Final   Triglycerides 98/97/7973 91  <150 mg/dL Final   HDL 98/97/7973 47  >40 mg/dL Final   Total CHOL/HDL Ratio 05/02/2024 4.8  RATIO Final   VLDL 05/02/2024 18  0 - 40 mg/dL Final   LDL Cholesterol 05/02/2024 162 (H)  0 - 99 mg/dL Final   TSH 98/97/7973 1.930  0.350 - 4.500 uIU/mL Final   Preg Test, Ur 05/02/2024 Negative  Negative Final   POC Amphetamine UR 05/02/2024 None Detected  NONE DETECTED  (Cut Off Level 1000 ng/mL) Final   POC Secobarbital (BAR) 05/02/2024 None Detected  NONE DETECTED (Cut Off Level 300 ng/mL) Final   POC Buprenorphine (BUP) 05/02/2024 None Detected  NONE DETECTED (Cut Off Level 10 ng/mL) Final   POC Oxazepam (BZO) 05/02/2024 Positive (A)  NONE DETECTED (Cut Off Level 300 ng/mL) Final   POC Cocaine UR 05/02/2024 None Detected  NONE DETECTED (Cut Off Level 300 ng/mL) Final   POC Methamphetamine UR 05/02/2024 None Detected  NONE DETECTED (Cut Off Level 1000 ng/mL) Final   POC Morphine  05/02/2024 None Detected  NONE DETECTED (Cut Off Level 300 ng/mL) Final   POC Methadone UR 05/02/2024 None Detected  NONE DETECTED (Cut Off Level 300 ng/mL) Final   POC Oxycodone  UR 05/02/2024 None Detected  NONE DETECTED (Cut Off Level 100 ng/mL) Final   POC Marijuana UR 05/02/2024 Positive (A)  NONE DETECTED (Cut Off Level 50 ng/mL) Final     Vitals: Blood pressure 114/75, pulse 61, temperature 98.1 F (36.7 C), temperature source Oral, resp. rate 16, height 5' 4 (1.626 m), weight 67.1 kg, SpO2 100%.    Penne Mori, DO, PGY1 [facility] Psychiatry

## 2024-05-05 NOTE — Group Note (Signed)
 Recreation Therapy Group Note   Group Topic:Communication  Group Date: 05/05/2024 Start Time: 0945 End Time: 1010 Facilitators: Annaleese Guier-McCall, LRT,CTRS Location: [address] Dayroom   Group Topic: Communication, Team Building, Problem Solving  Goal Area(s) Addresses:  Patient will effectively work with peer towards shared goal.  Patient will identify skills used to make activity successful.  Patient will identify how skills used during activity can be applied to reach post d/c goals.   Behavioral Response:   Intervention: STEM Activity- Glass Blower/designer  Activity: Tallest [employer]. In teams of 5-6, patients were given 11 craft pipe cleaners. Using the materials provided, patients were instructed to compete again the opposing team(s) to build the tallest free-standing structure from floor level. The activity was timed; difficulty increased by clinical research associate as production designer, theatre/television/film continued.  Systematically resources were removed with additional directions for example, placing one arm behind their back, working in silence, and shape stipulations. LRT facilitated post-activity discussion reviewing team processes and necessary communication skills involved in completion. Patients were encouraged to reflect how the skills utilized, or not utilized, in this activity can be incorporated to positively impact support systems post discharge.  Education: Pharmacist, Community, Scientist, Physiological, Discharge Planning   Education Outcome: Acknowledges education/In group clarification offered/Needs additional education.    Affect/Mood: N/A   Participation Level: Did not attend    Clinical Observations/Individualized Feedback:      Plan: Continue to engage patient in RT group sessions 2-3x/week.   Frisco Cordts-McCall, LRT,CTRS 05/05/2024 12:49 PM

## 2024-05-05 NOTE — Progress Notes (Signed)
 Tour of Duty:  Prentice JINNY Angle, RN, 05/05/2024, Tour of Duty: 0700-1900  SI/HI/AVH: Denies  Self-Reported   Mood: Neutral  Anxiety: Denies, but Observable Depression: Denies, but Observable Irritability: Denies, but Observable  Broset  Violence Prevention Guidelines *See Row Information*: Small Violence Risk interventions implemented   LBM  Last BM Date : 05/05/24   Pain: not present  Patient Refusals (including Rx): No  >>Shift Summary: Patient observed to be mildly anxious, depressed, and irritable, but overall calm and cooperative on unit. Patient able to make needs known. Patient observed to engage appropriately with staff and peers. Patient taking medications as prescribed. This shift, no PRN medication requested or required. No reported or observed side effects to medication. No reported or observed agitation, aggression, or other acute emotional distress. No reported or observed physical abnormalities or concerns. Patient granted access to cell phone per LP in order to pay rent. Patient preoccupied with discharge.   Last Vitals  Vitals Weight: 67.1 kg Temp: 98.1 F (36.7 C) Temp Source: Oral Pulse Rate: 67 Resp: 15 BP: 112/75 Patient Position: (not recorded)  Admission Type  Psych Admission Type (Psych Patients Only) Admission Status: Voluntary Date 72 hour document signed : (not recorded) Time 72 hour document signed : (not recorded) Provider Notified (First and Last Name) (see details for LINK to note): (not recorded)   Psychosocial Assessment  Psychosocial Assessment Patient Complaints: None Eye Contact: Fair Facial Expression: Anxious, Sullen Affect: Sullen Speech: Logical/coherent Interaction: Assertive Motor Activity: Other (Comment) (WDL) Appearance/Hygiene: Unremarkable Behavior Characteristics: Cooperative, Anxious Mood: Anxious   Aggressive Behavior  Targets: (not recorded)   Thought Process  Thought Process Coherency: Within Defined  Limits Content: Within Defined Limits Delusions: None reported or observed Perception: Within Defined Limits Hallucination: None reported or observed Judgment: Limited Confusion: None  Danger to Self/Others  Danger to Self Current suicidal ideation?: Denies Description of Suicide Plan: (not recorded) Self-Injurious Behavior: (not recorded) Agreement Not to Harm Self: (not recorded) Description of Agreement: (not recorded) Danger to Others: None reported or observed

## 2024-05-05 NOTE — [facility] Suicide Risk Assessment (Signed)
 [facility] INPATIENT:  Family/Significant Other Suicide Prevention Education  Suicide Prevention Education:  Education Completed; Angela Andrade (husband) [phone],  (name of family member/significant other) has been identified by the patient as the family member/significant other with whom the patient will be residing, and identified as the person(s) who will aid the patient in the event of a mental health crisis (suicidal ideations/suicide attempt).  With written consent from the patient, the family member/significant other has been provided the following suicide prevention education, prior to the and/or following the discharge of the patient.  The suicide prevention education provided includes the following: Suicide risk factors Suicide prevention and interventions National Suicide Hotline telephone number [facility] assessment telephone number [facility] Emergency Assistance 911 [facility] and/or Residential Mobile Crisis Unit telephone number  Request made of family/significant other to: Remove weapons (e.g., guns, rifles, knives), all items previously/currently identified as safety concern.   Remove drugs/medications (over-the-counter, prescriptions, illicit drugs), all items previously/currently identified as a safety concern.  No concerns reported prior to d/c .   The family member/significant other verbalizes understanding of the suicide prevention education information provided.  The family member/significant other agrees to remove the items of safety concern listed above.  Angela Andrade 05/05/2024, 10:30 AM

## 2024-05-05 NOTE — BH IP Treatment Plan (Signed)
 Interdisciplinary Treatment and Diagnostic Plan Update  05/05/2024 Time of Session: 10:55 AM Angela Andrade MRN: [id number]  Principal Diagnosis: Bipolar 1 disorder, mixed, severe (HCC)  Secondary Diagnoses: Principal Problem:   Bipolar 1 disorder, mixed, severe (HCC) Active Problems:   TBI (traumatic brain injury) (HCC)   Current Medications:  Current Facility-Administered Medications  Medication Dose Route Frequency Provider Last Rate Last Admin   alum & mag hydroxide-simeth (MAALOX/MYLANTA) 200-200-20 MG/5ML suspension 30 mL  30 mL Oral Q4H PRN Wilson, Hannia R, NP       haloperidol  (HALDOL ) tablet 5 mg  5 mg Oral TID PRN Wilson, Hannia R, NP   5 mg at 05/03/24 0915   And   diphenhydrAMINE  (BENADRYL ) capsule 50 mg  50 mg Oral TID PRN Wilson, Hannia R, NP   50 mg at 05/03/24 9083   haloperidol  lactate (HALDOL ) injection 5 mg  5 mg Intramuscular TID PRN Tanda Ardelle SAUNDERS, NP       And   diphenhydrAMINE  (BENADRYL ) injection 50 mg  50 mg Intramuscular TID PRN Tanda Ardelle SAUNDERS, NP       And   LORazepam  (ATIVAN ) injection 2 mg  2 mg Intramuscular TID PRN Wilson, Hannia R, NP       haloperidol  lactate (HALDOL ) injection 10 mg  10 mg Intramuscular TID PRN Tanda Ardelle SAUNDERS, NP       And   diphenhydrAMINE  (BENADRYL ) injection 50 mg  50 mg Intramuscular TID PRN Tanda Ardelle SAUNDERS, NP       And   LORazepam  (ATIVAN ) injection 2 mg  2 mg Intramuscular TID PRN Wilson, Hannia R, NP       divalproex  (DEPAKOTE  SPRINKLE) capsule 625 mg  625 mg Oral BID Prunty, Donald B, DO   625 mg at 05/05/24 9178   hydrOXYzine  (ATARAX ) tablet 25 mg  25 mg Oral TID PRN Wilson, Hannia R, NP       magnesium  hydroxide (MILK OF MAGNESIA) suspension 30 mL  30 mL Oral Daily PRN Wilson, Hannia R, NP       nicotine  (NICODERM CQ  - dosed in mg/24 hours) patch 14 mg  14 mg Transdermal Q0600 Wilson, Hannia R, NP       QUEtiapine  (SEROQUEL ) tablet 150 mg  150 mg Oral BID AC Chandra Charleston Sherlean Ruth, DO   150 mg at 05/05/24 9387    traZODone  (DESYREL ) tablet 50 mg  50 mg Oral QHS PRN Bobbitt, Shalon E, NP   50 mg at 05/04/24 2039   PTA Medications: Medications Prior to Admission  Medication Sig Dispense Refill Last Dose/Taking   albuterol  (VENTOLIN  HFA) 108 (90 Base) MCG/ACT inhaler Inhale 1-2 puffs into the lungs every 6 (six) hours as needed for wheezing or shortness of breath. 8 g 0    clonazePAM  (KLONOPIN ) 1 MG tablet Take 0.5-1 mg by mouth 2 (two) times daily as needed for anxiety (use sparingly).      ibuprofen  (ADVIL ) 200 MG tablet Take 200-600 mg by mouth every 6 (six) hours as needed for mild pain (pain score 1-3).       Patient Stressors:    Patient Strengths:    Treatment Modalities: Medication Management, Group therapy, Case management,  1 to 1 session with clinician, Psychoeducation, Recreational therapy.   Physician Treatment Plan for Primary Diagnosis: Bipolar 1 disorder, mixed, severe (HCC) Long Term Goal(s):     Short Term Goals:    Medication Management: Evaluate patient's response, side effects, and tolerance of medication regimen.  Therapeutic  Interventions: 1 to 1 sessions, Unit Group sessions and Medication administration.  Evaluation of Outcomes: Progressing  Physician Treatment Plan for Secondary Diagnosis: Principal Problem:   Bipolar 1 disorder, mixed, severe (HCC) Active Problems:   TBI (traumatic brain injury) (HCC)  Long Term Goal(s):     Short Term Goals:       Medication Management: Evaluate patient's response, side effects, and tolerance of medication regimen.  Therapeutic Interventions: 1 to 1 sessions, Unit Group sessions and Medication administration.  Evaluation of Outcomes: Progressing   RN Treatment Plan for Primary Diagnosis: Bipolar 1 disorder, mixed, severe (HCC) Long Term Goal(s): Knowledge of disease and therapeutic regimen to maintain health will improve  Short Term Goals: Ability to remain free from injury will improve, Ability to verbalize  frustration and anger appropriately will improve, Ability to demonstrate self-control, Ability to participate in decision making will improve, Ability to verbalize feelings will improve, Ability to disclose and discuss suicidal ideas, Ability to identify and develop effective coping behaviors will improve, and Compliance with prescribed medications will improve  Medication Management: RN will administer medications as ordered by provider, will assess and evaluate patient's response and provide education to patient for prescribed medication. RN will report any adverse and/or side effects to prescribing provider.  Therapeutic Interventions: 1 on 1 counseling sessions, Psychoeducation, Medication administration, Evaluate responses to treatment, Monitor vital signs and CBGs as ordered, Perform/monitor CIWA, COWS, AIMS and Fall Risk screenings as ordered, Perform wound care treatments as ordered.  Evaluation of Outcomes: Progressing   LCSW Treatment Plan for Primary Diagnosis: Bipolar 1 disorder, mixed, severe (HCC) Long Term Goal(s): Safe transition to appropriate next level of care at discharge, Engage patient in therapeutic group addressing interpersonal concerns.  Short Term Goals: Engage patient in aftercare planning with referrals and resources, Increase social support, Increase ability to appropriately verbalize feelings, Increase emotional regulation, Facilitate acceptance of mental health diagnosis and concerns, Facilitate patient progression through stages of change regarding substance use diagnoses and concerns, Identify triggers associated with mental health/substance abuse issues, and Increase skills for wellness and recovery  Therapeutic Interventions: Assess for all discharge needs, 1 to 1 time with Social worker, Explore available resources and support systems, Assess for adequacy in community support network, Educate family and significant other(s) on suicide prevention, Complete  Psychosocial Assessment, Interpersonal group therapy.  Evaluation of Outcomes: Progressing   Progress in Treatment: Attending groups: Yes. Participating in groups: Yes. Taking medication as prescribed: Yes. Toleration medication: Yes. Family/Significant other contact made: Yes, individual(s) contacted:  Taylia Berber (husband) [phone] Patient understands diagnosis: Yes. Discussing patient identified problems/goals with staff: Yes. Medical problems stabilized or resolved: Yes. Denies suicidal/homicidal ideation: Yes. Issues/concerns per patient self-inventory: No. None identified.  New problem(s) identified: No, Describe:  None reported.  New Short Term/Long Term Goal(s): medication stabilization, elimination of SI thoughts, development of comprehensive mental wellness plan.    Patient Goals:  Staying stable, continue with psychiatry upon discharge.   Discharge Plan or Barriers: Patient recently admitted. CSW will continue to follow and assess for appropriate referrals and possible discharge planning.    Reason for Continuation of Hospitalization: Medication stabilization  Estimated Length of Stay: 1-3 days.  Last 3 [place] Suicide Severity Risk Score: Flowsheet Row Ad[facility] (Current[facility] Most recent reading at 05/02/2024  8:30 PM ED from 05/02/2024 in [facility] Most recent reading at 05/02/2024  3:53 PM ED from 05/02/2024 in [facility] Most  recent reading at 05/02/2024 10:53 AM  C-SSRS RISK CATEGORY No Risk No Risk No Risk    Last PHQ 2/9 Scores:    09/03/2017   12:02 PM 08/09/2017   10:23 AM 07/09/2017    9:05 AM  Depression screen PHQ 2/9  Decreased Interest 0  0  Down, Depressed, Hopeless 0 0 0  PHQ - 2 Score 0 0 0  Altered sleeping 0 1 1  Tired, decreased energy 1 1 0  Change in appetite 1 1 1   Feeling bad or failure about yourself  0 0 0  Trouble  concentrating 0 0 0  Moving slowly or fidgety/restless 0 0 0  Suicidal thoughts 0 0 0  PHQ-9 Score 2  3  2       Data saved with a previous flowsheet row definition    Scribe for Treatment Team: Sedrick Tober  Nunez-Uva, LCSWA 05/05/2024 4:37 PM

## 2024-05-05 NOTE — Plan of Care (Signed)
   Problem: Education: Goal: Emotional status will improve Outcome: Progressing Goal: Mental status will improve Outcome: Progressing

## 2024-05-06 MED ORDER — DIVALPROEX SODIUM 125 MG PO CSDR: 625.0000 mg | DELAYED_RELEASE_CAPSULE | Freq: Two times a day (BID) | ORAL | 2 refills | Status: AC

## 2024-05-06 MED ORDER — QUETIAPINE FUMARATE 150 MG PO TABS: 150.0000 mg | ORAL_TABLET | Freq: Two times a day (BID) | ORAL | 2 refills | Status: AC

## 2024-05-06 MED ORDER — TRAZODONE HCL 50 MG PO TABS: 50.0000 mg | ORAL_TABLET | Freq: Every evening | ORAL | 0 refills | Status: AC | PRN

## 2024-05-06 NOTE — [facility] Suicide Risk Assessment (Signed)
 Suicide Risk Assessment  Discharge Assessment    [facility] Discharge Suicide Risk Assessment   Principal Problem: Bipolar 1 disorder, mixed, severe (HCC) Discharge Diagnoses: Principal Problem:   Bipolar 1 disorder, mixed, severe (HCC) Active Problems:   TBI (traumatic brain injury) (HCC)   Musculoskeletal: Strength & Muscle Tone: within normal limits Gait & Station: normal Patient leans: N/A  Psychiatric Specialty Exam  Presentation  General Appearance:  Appropriate for Environment  Eye Contact: Fair  Speech: Clear and Coherent; Normal Rate  Speech Volume: Normal  Handedness: Right   Mood and Affect  Mood: Euthymic  Duration of Depression Symptoms: Greater than two weeks  Affect: Congruent; Appropriate   Thought Process  Thought Processes: Coherent  Descriptions of Associations:Intact  Orientation:Full (Time, Place and Person)  Thought Content:Logical  History of Schizophrenia/Schizoaffective disorder:No  Duration of Psychotic Symptoms:No data recorded Hallucinations:Hallucinations: None  Ideas of Reference:None  Suicidal Thoughts:Suicidal Thoughts: No  Homicidal Thoughts:Homicidal Thoughts: No   Sensorium  Memory: Immediate Good; Recent Good  Judgment: Fair  Insight: Fair   Art Therapist  Concentration: Fair  Attention Span: Fair  Recall: Fair  Fund of Knowledge: Fair  Language: Fair   Psychomotor Activity  Psychomotor Activity:Psychomotor Activity: Normal   Assets  Assets: Desire for Improvement; Communication Skills; Financial Resources/Insurance; Housing; Physical Health; Social Support; Resilience; Talents/Skills; Vocational/Educational   Sleep  Sleep:Sleep: Good  Estimated Sleeping Duration (Last 24 Hours): 5.75-6.75 hours  Physical Exam: Physical Exam Vitals reviewed.  Constitutional:      General: She is not in acute distress.    Appearance: She is not toxic-appearing.  Pulmonary:      Effort: Pulmonary effort is normal. No respiratory distress.  Skin:    General: Skin is warm and dry.  Neurological:     Mental Status: She is alert and oriented to person, place, and time.     Gait: Gait normal.    Review of Systems  Constitutional: Negative.  Negative for fever.  Respiratory: Negative.  Negative for shortness of breath.   Cardiovascular: Negative.   Gastrointestinal: Negative.  Negative for constipation and diarrhea.   Blood pressure 120/66, pulse 60, temperature 98.3 F (36.8 C), temperature source Oral, resp. rate 16, height 5' 4 (1.626 m), weight 67.1 kg, SpO2 100%. Body mass index is 25.4 kg/m.  Mental Status Per Nursing Assessment::   On Admission:  NA  Demographic Factors:  NA  Loss Factors: NA  Historical Factors: Family history of mental illness or substance abuse  Risk Reduction Factors:   Responsible for children under 50 years of age, Sense of responsibility to family, Employed, Living with another person, especially a relative, Positive social support, Positive therapeutic relationship, and Positive coping skills or problem solving skills  Continued Clinical Symptoms:  Bipolar Disorder:   Mixed State  Cognitive Features That Contribute To Risk:  None    Suicide Risk:  Minimal: No identifiable suicidal ideation.  Patients presenting with minimal risk factors but with morbid ruminations; may be classified as minimal risk based on the severity of the depressive symptoms   Follow-up Information     [facility] Follow up on 05/14/2024.   Why: You have a hospital follow up appointment for therapy and medication management services on 05/14/24 at 8:00 am The appointment will be Virtual, telehealth. Contact information: [address]  [address] [place] [address] [phone]                 Plan Of Care/Follow-up recommendations:  Discharge to home.  Penne Mori, DO, PGY1 05/06/2024, 9:14 AM

## 2024-05-06 NOTE — Progress Notes (Signed)
" °  [facility] Adult Case Management Discharge Plan :  Will you be returning to the same living situation after discharge:  Yes,  patient will be returning home to address on file. At discharge, do you have transportation home?: No. CSW set up Safe Transport for 10:30 am for patient to return to vehicle parked at the [facility]. CSW confirmed keys in patient's belongings.  Do you have the ability to pay for your medications: Yes,  patient has active health insurance.   Release of information consent forms completed and in the chart;  Patient's signature needed at discharge.  Patient to Follow up at:  Follow-up Information     [facility] Follow up on 05/14/2024.   Why: You have a hospital follow up appointment for therapy and medication management services on 05/14/24 at 8:00 am The appointment will be Virtual, telehealth. Contact information: [address]  [address] [place] [address] [phone]                 Next level of care provider has access to [facility]:no  Safety Planning and Suicide Prevention discussed: Yes,  completed with Khamiyah Grefe (husband) [phone].      Has patient been referred to the Quitline?: Patient refused referral for treatment  Patient has been referred for addiction treatment: No known substance use disorder.  Louetta Lame, LCSWA 05/06/2024, 9:48 AM "

## 2024-05-06 NOTE — Progress Notes (Signed)
(  Sleep Hours) -7.5 (Any PRNs that were needed, meds refused, or side effects to meds)- Trazodone  (Any disturbances and when (visitation, over night)- none (Concerns raised by the patient)- none (SI/HI/AVH)- denies

## 2024-05-06 NOTE — Plan of Care (Signed)
" °  Problem: Education: Goal: Emotional status will improve Outcome: Completed/Met   Problem: Activity: Goal: Interest or engagement in activities will improve Outcome: Completed/Met   Problem: Education: Goal: Verbalization of understanding the information provided will improve Outcome: Completed/Met   Problem: Physical Regulation: Goal: Ability to maintain clinical measurements within normal limits will improve Outcome: Completed/Met   "

## 2024-05-06 NOTE — Discharge Summary (Signed)
 " Physician Discharge Summary Note  Patient:  Angela Andrade is an 44 y.o., female MRN:  [id number] DOB:  [birth year] Patient phone:  [phone] (home)  Patient address:   [address] [place] [address],    Date of Admission:  05/02/2024 Date of Discharge: 05/06/2024  Reason for Admission:  Crisis stabilization, SI  Principal Problem: Bipolar 1 disorder, mixed, severe (HCC) Discharge Diagnoses: Principal Problem:   Bipolar 1 disorder, mixed, severe (HCC) Active Problems:   TBI (traumatic brain injury) [facility])   Past Psychiatric History:  Psychiatric history: Patient was hospitalized for psychiatric concerns about 10 years ago, but has had no other psychiatric hospitalizations. Reported suicide attempt as a teenager, but no attempts or ideations since then.   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    Bipolar I disorder (HCC)    brought on after TBI. Followed at the [facility]- Dr. Senna   Blood transfusion without reported diagnosis    Chronic pain syndrome    Depression    doing ok   H/O traumatic brain injury    Headache    Infection    UTI   Ovarian cyst    PID (pelvic inflammatory disease)    Trichomoniasis    of the cervix    Past Surgical History:  Procedure Laterality Date   brain injury     coma     HIP FRACTURE SURGERY     PELVIC FRACTURE SURGERY     spleen repair     Family History:  Family History  Problem Relation Age of Onset   Diabetes Maternal Uncle    Hypertension Maternal Uncle    Cancer Mother        breast   Hypertension Mother    Diabetes Maternal Aunt    Hypertension Maternal Aunt    Cancer Maternal Grandmother    Diabetes Maternal Grandmother    Hypertension Maternal Grandmother     Social History:  Social History   Substance and Sexual Activity   Alcohol Use No     Social History   Substance and Sexual Activity  Drug Use No    Social History   Socioeconomic History   Marital status: Single    Spouse name: Not on file   Number of children: 4   Years of education: Not on file   Highest education level: Some college, no degree  Occupational History   Not on file  Tobacco Use   Smoking status: Every Day    Current packs/day: 0.25    Average packs/day: 0.3 packs/day for 28.0 years (7.0 ttl pk-yrs)    Types: Cigarettes    Start date: 05/02/1996   Smokeless tobacco: Never  Vaping Use   Vaping status: Never Used  Substance and Sexual Activity   Alcohol use: No   Drug use: No   Sexual activity: Yes    Birth control/protection: Pill  Other Topics Concern   Not on file  Social History Narrative   Lives with her 4 children. Oldest 18, youngest 1.    Right handed   Caffeine: maybe 1 soda every 2-4 days. Mainly water.    Social Drivers of Health   Tobacco Use: High Risk (05/02/2024)   Patient History    Smoking Tobacco Use: Every Day  Smokeless Tobacco Use: Never    Passive Exposure: Not on file  Financial Resource Strain: Low Risk (04/26/2024)   Received from [facility]   Overall Financial Resource Strain (CARDIA)    How hard is it for you to pay for the very basics like food, housing, medical care, and heating?: Not hard at all  Food Insecurity: No Food Insecurity (05/02/2024)   Epic    Worried About Radiation Protection Practitioner of Food in the Last Year: Never true    Ran Out of Food in the Last Year: Never true  Transportation Needs: No Transportation Needs (05/02/2024)   Epic    Lack of Transportation (Medical): No    Lack of Transportation (Non-Medical): No  Physical Activity: Insufficiently Active (04/26/2024)   Received from [facility]   Exercise Vital Sign    On average, how many days per week do you engage in moderate to strenuous exercise (like a brisk walk)?: 3 days    On average, how many minutes do you engage in  exercise at this level?: 10 min  Stress: Stress Concern Present (04/26/2024)   Received from [facility] - Occupational Stress Questionnaire    Do you feel stress - tense, restless, nervous, or anxious, or unable to sleep at night because your mind is troubled all the time - these days?: Rather much  Social Connections: Somewhat Isolated (04/26/2024)   Received from [facility]   Social Network    How would you rate your social network (family, work, friends)?: Restricted participation with some degree of social isolation  Depression (PHQ2-9): Not on file  Alcohol Screen: Low Risk (05/03/2024)   Alcohol Screen    Last Alcohol Screening Score (AUDIT): 0  Housing: Low Risk (05/02/2024)   Epic    Unable to Pay for Housing in the Last Year: No    Number of Times Moved in the Last Year: 0    Homeless in the Last Year: No  Utilities: Not At Risk (05/02/2024)   Epic    Threatened with loss of utilities: No  Health Literacy: Not on file    Hospital Course:   Patient presented with 1 year of worsening mood lability characterized by shifts toward anger and depression which have greatly decreased her quality of life. 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 These episodes used to come and go for minutes at a time, then days, and now seems to be constant over the last several weeks. This got noticeably worse in the summer where patient feels like she is rarely in control of her emotions.   She appeared to be in a mixed-state manic episode on admission. She was started on Depakote  625mg  BID and Seroquel  150mg  BID. She immediatly responded very well to this, slept well the first night, and her mood stabilized the next day. She was monitored for several days and discharged without return of mania. She was counselled multiple times on the teratogenic risks of depakote  during  her stay.  Physical Findings: AIMS:  , ,  ,  ,  ,  ,   CIWA:    COWS:     Musculoskeletal: Strength & Muscle Tone: within normal limits Gait & Station: normal Patient leans: N/A   Psychiatric Specialty Exam:  Presentation  General Appearance:  Appropriate for Environment  Eye Contact: Fair  Speech: Clear and Coherent; Normal Rate  Speech Volume: Normal  Handedness: Right   Mood and Affect  Mood: Euthymic  Affect: Congruent; Appropriate   Thought Process  Thought Processes: Coherent  Descriptions of Associations:Intact  Orientation:Full (Time, Place and Person)  Thought Content:Logical  History of Schizophrenia/Schizoaffective disorder:No  Duration of Psychotic Symptoms:No data recorded Hallucinations:Hallucinations: None  Ideas of Reference:None  Suicidal Thoughts:Suicidal Thoughts: No  Homicidal Thoughts:Homicidal Thoughts: No   Sensorium  Memory: Immediate Good; Recent Good  Judgment: Fair  Insight: Fair   Art Therapist  Concentration: Fair  Attention Span: Fair  Recall: [employer] of Knowledge: Fair  Language: Fair   Psychomotor Activity  Psychomotor Activity: Psychomotor Activity: Normal   Assets  Assets: Desire for Improvement; Communication Skills; Financial Resources/Insurance; Housing; Physical Health; Social Support; Resilience; Talents/Skills; Vocational/Educational   Sleep  Sleep: Sleep: Good  Estimated Sleeping Duration (Last 24 Hours): 5.75-6.75 hours   Physical Exam: Physical Exam ROS Blood pressure 120/66, pulse 60, temperature 98.3 F (36.8 C), temperature source Oral, resp. rate 16, height 5' 4 (1.626 m), weight 67.1 kg, SpO2 100%. Body mass index is 25.4 kg/m.   Tobacco Use History[1] Tobacco Cessation:  N/A, patient does not currently use tobacco products   Blood Alcohol level:  Lab Results  Component Value Date   [facility] <15 05/02/2024   ETH <11 03/03/2014    Metabolic  Disorder Labs:  Lab Results  Component Value Date   HGBA1C 4.9 05/02/2024   MPG 93.93 05/02/2024   No results found for: PROLACTIN Lab Results  Component Value Date   CHOL 227 (H) 05/02/2024   TRIG 91 05/02/2024   HDL 47 05/02/2024   CHOLHDL 4.8 05/02/2024   VLDL 18 05/02/2024   LDLCALC 162 (H) 05/02/2024    See Psychiatric Specialty Exam and Suicide Risk Assessment completed by Attending Physician prior to discharge.  Discharge destination:  Home  Is patient on multiple antipsychotic therapies at discharge:  No   Has Patient had three or more failed trials of antipsychotic monotherapy by history:  No  Recommended Plan for Multiple Antipsychotic Therapies: NA  Discharge Instructions     Call MD for:  difficulty breathing, headache or visual disturbances   Complete by: As directed    Call MD for:  extreme fatigue   Complete by: As directed    Call MD for:  hives   Complete by: As directed    Call MD for:  persistant dizziness or light-headedness   Complete by: As directed    Call MD for:  persistant nausea and vomiting   Complete by: As directed    Call MD for:  redness, tenderness, or signs of infection (pain, swelling, redness, odor or green/yellow discharge around incision site)   Complete by: As directed    Call MD for:  severe uncontrolled pain   Complete by: As directed    Call MD for:  temperature >100.4   Complete by: As directed    Increase activity slowly   Complete by: As directed       Allergies as of 05/06/2024       Reactions   Chicken Allergy    Hydrocodone Itching   Ok to take with benadryl    Morphine  And Codeine Itching   Ok to take with benadryl    Tylenol  [acetaminophen ] Itching   Zyprexa  [olanzapine ] Other (See Comments)   hallucinations        Medication List     STOP taking these medications    clonazePAM  1 MG tablet Commonly known  as: KLONOPIN    ibuprofen  200 MG tablet Commonly known as: ADVIL        TAKE these  medications      Indication  albuterol  108 (90 Base) MCG/ACT inhaler Commonly known as: VENTOLIN  HFA Inhale 1-2 puffs into the lungs every 6 (six) hours as needed for wheezing or shortness of breath.  Indication: Asthma   divalproex  125 MG capsule Commonly known as: DEPAKOTE  SPRINKLE Take 5 capsules (625 mg total) by mouth 2 (two) times daily.  Indication: MIXED BIPOLAR AFFECTIVE DISORDER   QUEtiapine  Fumarate 150 MG Tabs Take 150 mg by mouth 2 (two) times daily before a meal.  Indication: Manic-Depression   traZODone  50 MG tablet Commonly known as: DESYREL  Take 1 tablet (50 mg total) by mouth at bedtime as needed for sleep.  Indication: Trouble Sleeping        Follow-up Information     [facility] Follow up on 05/14/2024.   Why: You have a hospital follow up appointment for therapy and medication management services on 05/14/24 at 8:00 am The appointment will be Virtual, telehealth. Contact information: [address]  [address] [place] [address] [phone]                 Follow-up recommendations:  per above.  Signed: Penne Mori, DO, PGY1 05/06/2024, 5:02 PM           [1]  Social History Tobacco Use  Smoking Status Every Day   Current packs/day: 0.25   Average packs/day: 0.3 packs/day for 28.0 years (7.0 ttl pk-yrs)   Types: Cigarettes   Start date: 05/02/1996  Smokeless Tobacco Never   "

## 2024-05-06 NOTE — Group Note (Signed)
 Date:  05/06/2024 Time:  9:51 AM  Group Topic/Focus: Goals and orientation Goals Group:   The focus of this group is to help patients establish daily goals to achieve during treatment and discuss how the patient can incorporate goal setting into their daily lives to aide in recovery. Orientation:   The focus of this group is to educate the patient on the purpose and policies of crisis stabilization and provide a format to answer questions about their admission.  The group details unit policies and expectations of patients while admitted.    Participation Level:  Minimal  Participation Quality:  Appropriate  Affect:  Appropriate  Cognitive:  Alert and Appropriate  Insight: Appropriate  Engagement in Group:  Improving  Modes of Intervention:  Orientation  Additional Comments:  Patient came in towards the end of group  Everlene Cunning M Ronna Herskowitz 05/06/2024, 9:51 AM

## 2024-05-06 NOTE — Progress Notes (Signed)
 Ophelia GORMAN Mosher  D/C'd Home per MD order.  Discussed with the patient and all questions fully answered.   An After Visit Summary was printed and given to the patient. Patient received prescription.  D/c education completed with patient including follow up instructions, medication list, d/c activities limitations if indicated, with other d/c instructions as indicated by MD - patient able to verbalize understanding, all questions fully answered.   Patient instructed to return to ED, call 911, or call MD for any changes in condition.   Patient escorted to the main entrance, and D/C home via private auto.  Joaquin MALVA Doing 05/06/2024 11:43 AM

## 2024-05-06 NOTE — Group Note (Signed)
 Date:  05/06/2024 Time:  10:09 AM  Group Topic/Focus: Pet therapy Pet therapy, also known as animal-assisted therapy, is a supportive approach used to improve mental health in adults by incorporating trained animals into therapeutic settings. Interaction with animals can help reduce stress, anxiety, and feelings of loneliness while promoting relaxation and emotional comfort. Pet therapy has been shown to improve mood, encourage social interaction, and increase engagement in treatment, especially for individuals experiencing depression, anxiety, PTSD, or chronic stress. When guided by a trained professional and used alongside traditional mental health care, pet therapy can be a safe and effective way to support emotional well-being and overall mental health.    Participation Level:  Did Not Attend   Angela Andrade 05/06/2024, 10:09 AM

## 2024-05-06 NOTE — Transportation (Signed)
 05/06/2024  Angela Andrade DOB: 1980-12-03 MRN: [id number]   RIDER WAIVER AND RELEASE OF LIABILITY  For the purposes of helping with transportation needs, [facility] partners with outside transportation providers (taxi companies, [place], catering manager.) to give [facility] patients or other approved people the choice of on-demand rides Public Librarian) to our buildings for non-emergency visits.  By using [employer], I, the person signing this document, on behalf of myself and/or any legal minors (in my care using the [employer]), agree:  Science Writer given to me are supplied by independent, outside transportation providers who do not work for, or have any affiliation with, [employer]. [place] is not a transportation company. [facility] has no control over the quality or safety of the rides I get using [employer]. [place] has no control over whether any outside ride will happen on time or not. [facility] gives no guarantee on the reliability, quality, safety, or availability on any rides, or that no mistakes will happen. I know and accept that traveling by vehicle (car, truck, SVU, fleeta, bus, taxi, etc.) has risks of serious injuries such as disability, being paralyzed, and death. I know and agree the risk of using [employer] is mine alone, and not [employer]. [employer] are provided as is and as are available. The transportation providers are in charge for all inspections and care of the vehicles used to provide these rides. I agree not to take legal action against [place], its agents, employees, officers, directors, representatives, insurers, attorneys, assigns, successors, subsidiaries, and affiliates at any time for any reasons related directly or indirectly to using [employer]. I also agree not to take legal action against [place] or its affiliates for any injury, death, or damage to property caused by or related to using  [employer]. I have read this Waiver and Release of Liability, and I understand the terms used in it and their legal meaning. This Waiver is freely and voluntarily given with the understanding that my right (or any legal minors) to legal action against [place] relating to [employer] is knowingly given up to use these services.   I attest that I read the Ride Waiver and Release of Liability to Angela Andrade, gave Ms. Hudlow the opportunity to ask questions and answered the questions asked (if any). I affirm that [place] S [place] then provided consent for assistance with transportation.

## 2024-05-14 LAB — POCT PREGNANCY, URINE: Preg Test, Ur: NEGATIVE
